# Patient Record
Sex: Female | Born: 1965
Health system: Southern US, Community
[De-identification: ages and names within clinical notes are randomized; demographics above are authoritative.]

---

## 1997-10-22 ENCOUNTER — Inpatient Hospital Stay (HOSPITAL_COMMUNITY): Admission: AD | Admit: 1997-10-22 | Discharge: 1997-10-24 | Payer: Self-pay | Admitting: Obstetrics and Gynecology

## 1997-11-02 ENCOUNTER — Encounter (HOSPITAL_COMMUNITY): Admission: RE | Admit: 1997-11-02 | Discharge: 1998-01-31 | Payer: Self-pay | Admitting: Obstetrics and Gynecology

## 1999-01-21 ENCOUNTER — Inpatient Hospital Stay (HOSPITAL_COMMUNITY): Admission: AD | Admit: 1999-01-21 | Discharge: 1999-01-24 | Payer: Self-pay | Admitting: Obstetrics and Gynecology

## 1999-01-21 ENCOUNTER — Encounter (INDEPENDENT_AMBULATORY_CARE_PROVIDER_SITE_OTHER): Payer: Self-pay

## 1999-03-01 ENCOUNTER — Other Ambulatory Visit: Admission: RE | Admit: 1999-03-01 | Discharge: 1999-03-01 | Payer: Self-pay | Admitting: Obstetrics and Gynecology

## 2000-02-28 ENCOUNTER — Encounter (INDEPENDENT_AMBULATORY_CARE_PROVIDER_SITE_OTHER): Payer: Self-pay | Admitting: Specialist

## 2000-02-28 ENCOUNTER — Ambulatory Visit (HOSPITAL_BASED_OUTPATIENT_CLINIC_OR_DEPARTMENT_OTHER): Admission: RE | Admit: 2000-02-28 | Discharge: 2000-02-28 | Payer: Self-pay | Admitting: Oral Surgery

## 2000-06-06 ENCOUNTER — Other Ambulatory Visit: Admission: RE | Admit: 2000-06-06 | Discharge: 2000-06-06 | Payer: Self-pay | Admitting: Obstetrics and Gynecology

## 2001-06-17 ENCOUNTER — Other Ambulatory Visit: Admission: RE | Admit: 2001-06-17 | Discharge: 2001-06-17 | Payer: Self-pay

## 2003-10-27 ENCOUNTER — Other Ambulatory Visit: Admission: RE | Admit: 2003-10-27 | Discharge: 2003-10-27 | Payer: Self-pay | Admitting: Obstetrics and Gynecology

## 2004-12-23 ENCOUNTER — Other Ambulatory Visit: Admission: RE | Admit: 2004-12-23 | Discharge: 2004-12-23 | Payer: Self-pay | Admitting: Obstetrics and Gynecology

## 2007-03-26 ENCOUNTER — Encounter: Admission: RE | Admit: 2007-03-26 | Discharge: 2007-03-26 | Payer: Self-pay | Admitting: Obstetrics and Gynecology

## 2008-02-25 ENCOUNTER — Ambulatory Visit (HOSPITAL_COMMUNITY): Admission: RE | Admit: 2008-02-25 | Discharge: 2008-02-25 | Payer: Self-pay | Admitting: Obstetrics and Gynecology

## 2008-07-28 ENCOUNTER — Ambulatory Visit (HOSPITAL_COMMUNITY): Admission: RE | Admit: 2008-07-28 | Discharge: 2008-07-28 | Payer: Self-pay | Admitting: Obstetrics and Gynecology

## 2008-08-12 ENCOUNTER — Encounter (INDEPENDENT_AMBULATORY_CARE_PROVIDER_SITE_OTHER): Payer: Self-pay | Admitting: Interventional Radiology

## 2008-08-12 ENCOUNTER — Ambulatory Visit (HOSPITAL_COMMUNITY): Admission: RE | Admit: 2008-08-12 | Discharge: 2008-08-12 | Payer: Self-pay | Admitting: Obstetrics and Gynecology

## 2010-06-04 ENCOUNTER — Emergency Department (HOSPITAL_COMMUNITY)
Admission: EM | Admit: 2010-06-04 | Discharge: 2010-06-04 | Payer: Self-pay | Source: Home / Self Care | Admitting: Emergency Medicine

## 2010-06-04 LAB — DIFFERENTIAL
Basophils Relative: 0 % (ref 0–1)
Eosinophils Absolute: 0.1 10*3/uL (ref 0.0–0.7)
Neutro Abs: 7.5 10*3/uL (ref 1.7–7.7)
Neutrophils Relative %: 82 % — ABNORMAL HIGH (ref 43–77)

## 2010-06-04 LAB — CBC
HCT: 38.7 % (ref 36.0–46.0)
Hemoglobin: 13.3 g/dL (ref 12.0–15.0)
MCH: 32.8 pg (ref 26.0–34.0)
Platelets: 195 10*3/uL (ref 150–400)
RBC: 4.05 MIL/uL (ref 3.87–5.11)
RDW: 12 % (ref 11.5–15.5)
WBC: 9.2 10*3/uL (ref 4.0–10.5)

## 2010-06-04 LAB — BASIC METABOLIC PANEL
CO2: 27 mEq/L (ref 19–32)
Creatinine, Ser: 0.84 mg/dL (ref 0.4–1.2)
GFR calc Af Amer: >60 mL/min (ref >60)
Glucose, Bld: 114 mg/dL — ABNORMAL HIGH (ref 70–99)

## 2010-06-08 IMAGING — US US SOFT TISSUE HEAD/NECK
1 series · 14 of 25 positions shown · non-contrast
Comparison: None.

CLINICAL DATA: 43-year-old female with goiter.

THYROID ULTRASOUND
TECHNIQUE: Ultrasound examination of the thyroid gland and
adjacent soft tissues was performed.

[Series 1: us soft tissue head/neck · 0.06mm/px · 14 of 36 slices shown]
[im 1/36]
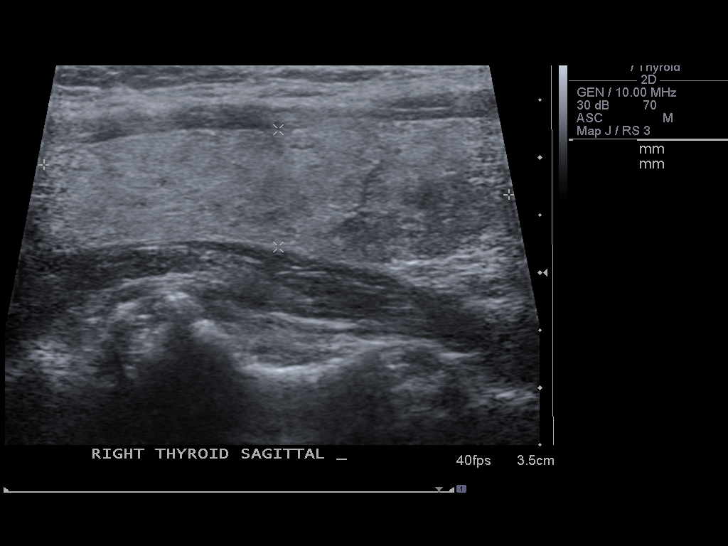
[im 3/36]
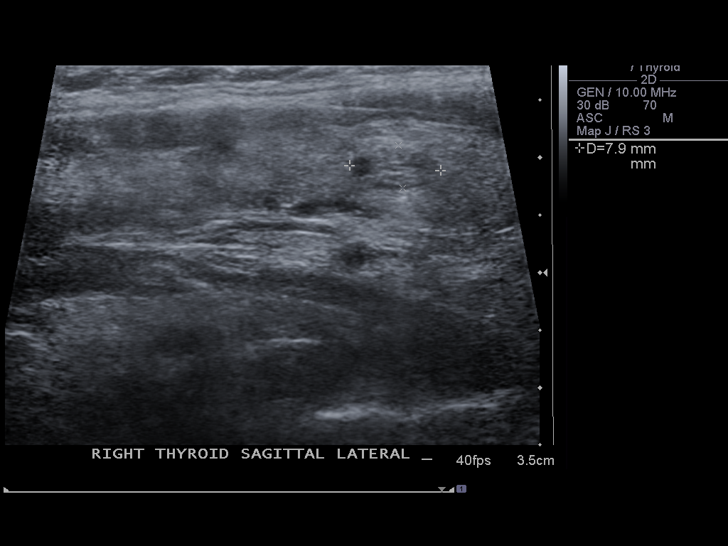
[im 6/36]
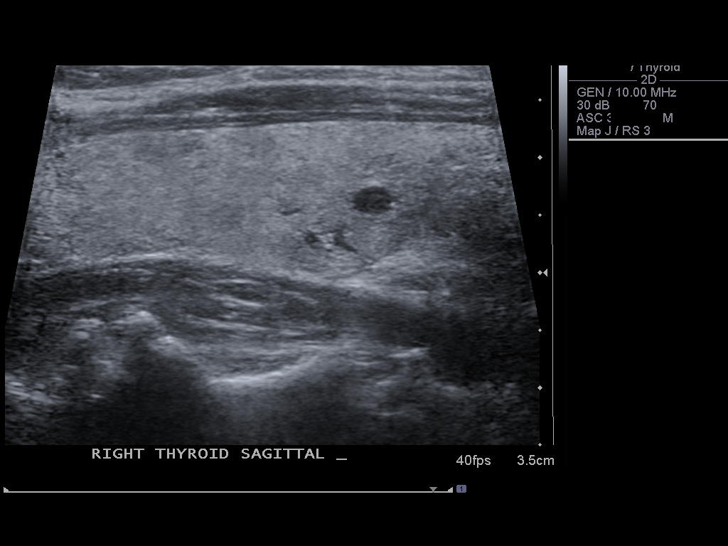
[im 9/36]
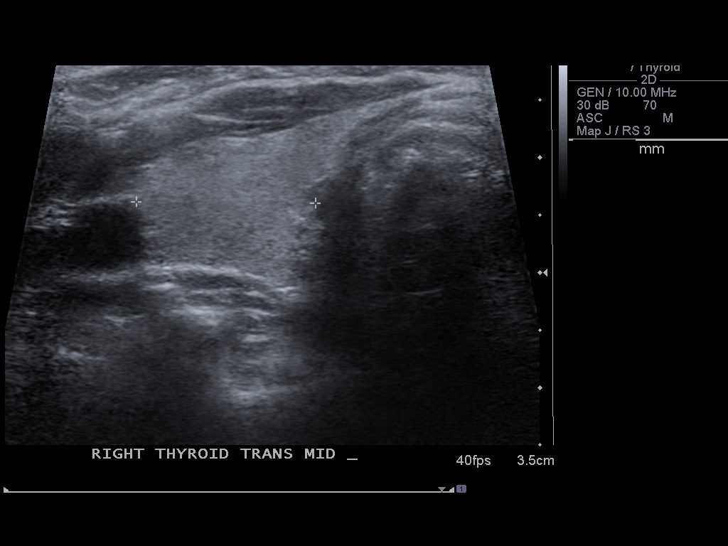
[im 12/36]
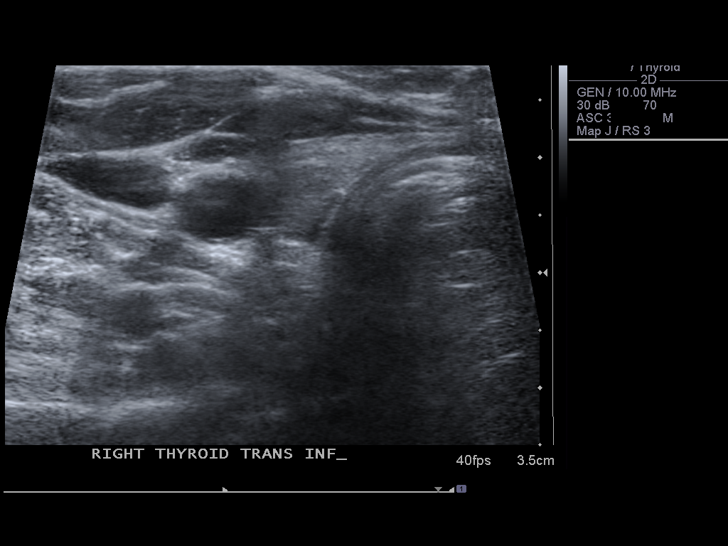
[im 14/36]
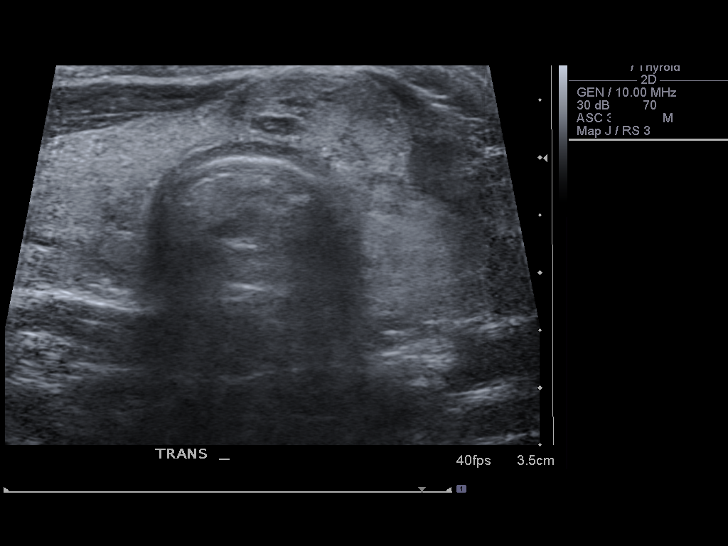
[im 17/36]
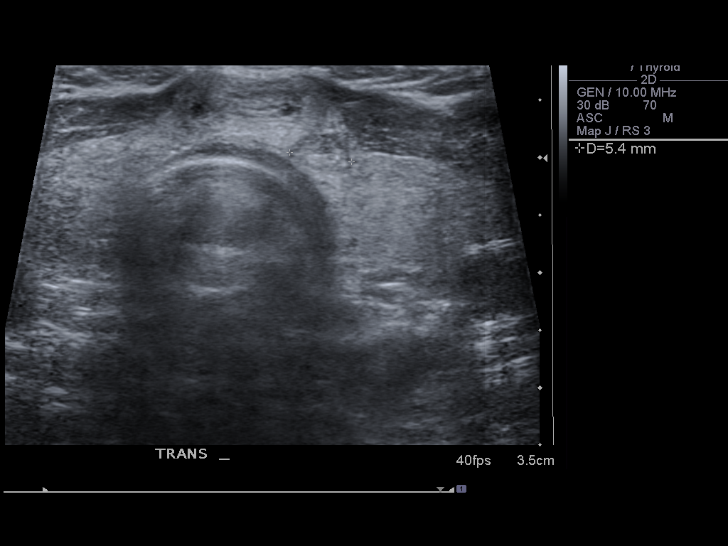
[im 19/36]
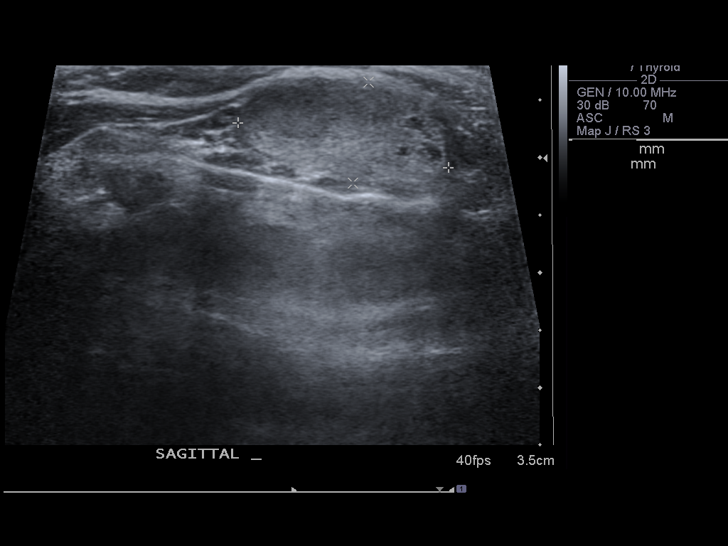
[im 22/36]
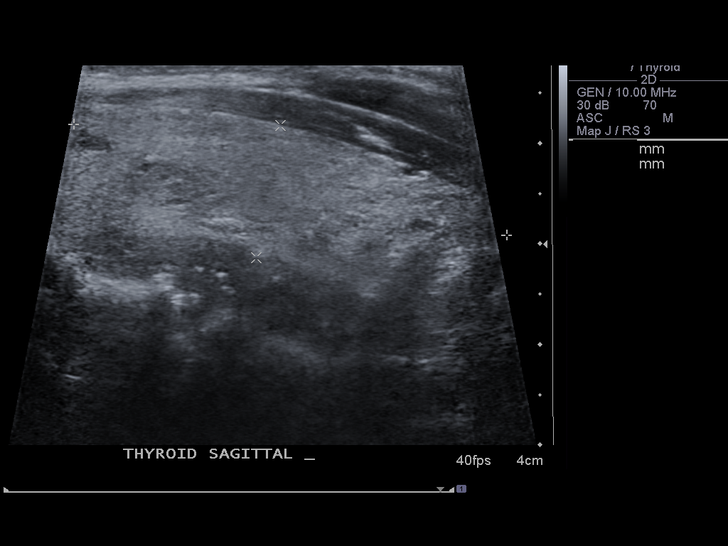
[im 24/36]
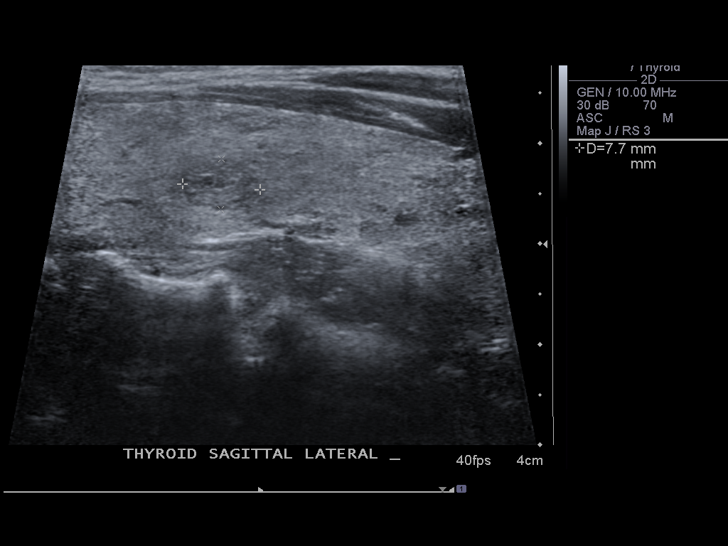
[im 27/36]
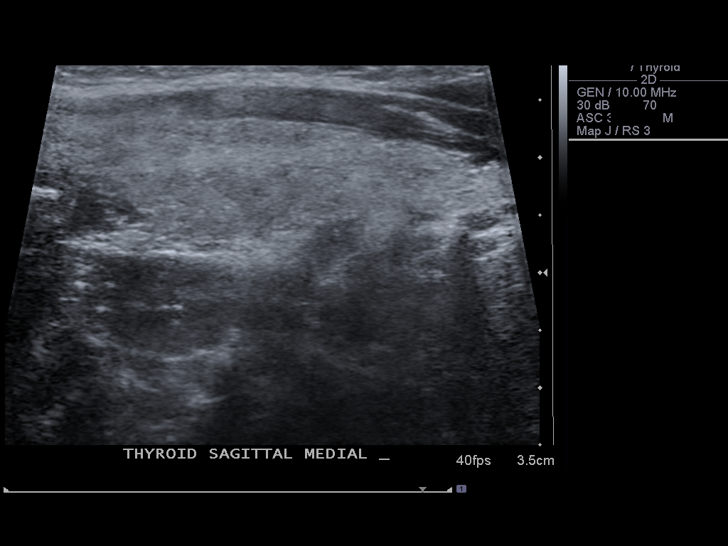
[im 30/36]
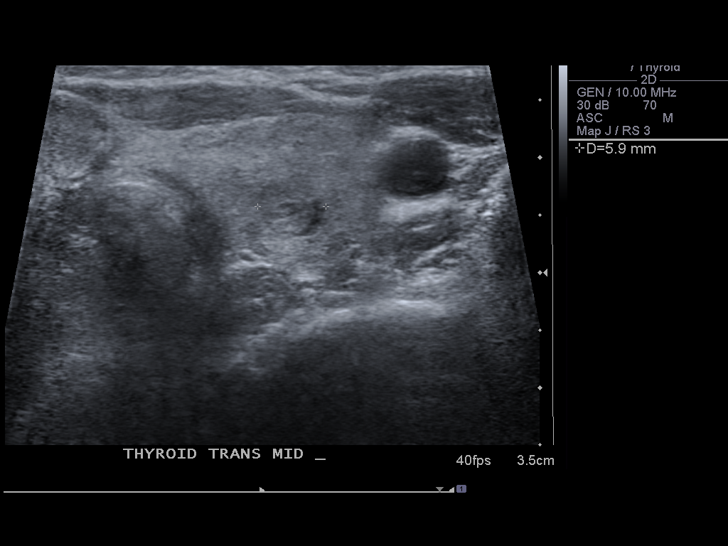
[im 33/36]
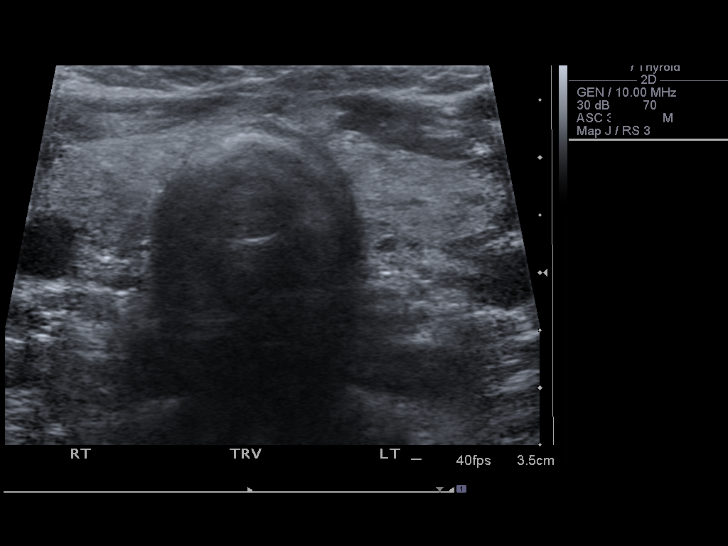
[im 36/36]
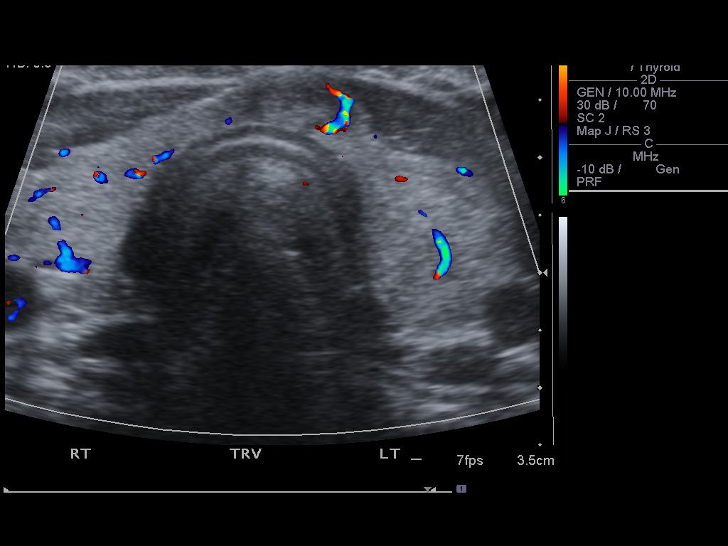

[14 of 25 positions shown; findings below may reference images not displayed]

FINDINGS: The right thyroid lobe is relatively normal measuring
x 1.0 x 1.6 cm although it does contain a sub centimeter
heterogeneous hypo and isoechoic nodule in the lower pole measuring
8 x 4 x 4 mm.

The isthmic region is abnormal and largely replaced by a
heterogeneous hypo to hyperechoic nodule with vascularity measuring
1.9 x 1.6 x 0.9 cm.  Smaller adjacent nodule measuring up to 7 mm
is seen at the junction of the isthmus in the left lobe.

The left lobe measures 4.4 x 1.3 x 1.5 cm and is within normal
limits aside from an isoechoic nodule measuring 8 x 5 x 6 mm
located in the mid pole.  This nodule has scanned vascularity.
IMPRESSION: 1.  Dominant thyroid nodule replacing the isthmus measuring 1.9 x
1.6 x 0.9 cm with vascularity.  Recommend ultrasound guided fine
needle aspiration for histologic correlation.
2.  Otherwise occasional sub centimeter thyroid nodules
bilaterally.

## 2010-07-06 NOTE — Consult Note (Signed)
  NAME:  Linda Clements, CHANDRAN NO.:  [id number]  MEDICAL RECORD NO.:  [id number]          PATIENT TYPE:  EMS  LOCATION:  ED                           FACILITY:  [facility]  PHYSICIAN:  Erasmo Leventhal, M.D.DATE OF BIRTH:  05/14/1965  DATE OF CONSULTATION: DATE OF DISCHARGE:  06/04/2010                                CONSULTATION   HISTORY OF PRESENT ILLNESS:  [name] is a 45 year old right-hand dominant female.  She was at [facility] today with her children, fell onto her left upper extremity, had acute onset of pain. She was brought to the [facility] ED.  She was evaluated there by Dr. Margretta Ditty and found to have  left humerus fracture and I was called. .  I evaluated the patient, she was awake, alert, and oriented to person, place, time, and circumstance.  She only complains of left arm pain.  ALLERGIES:  None.  MEDICATIONS:  None.  PAST SURGICAL HISTORY:  None.  PHYSICAL EXAMINATION:  GENERAL:  She is a very pleasant lady, fairly comfortable. EXTREMITIES:  Left arm showed 2+ swelling, compartments were soft.  Skin is intact.  Distal pulses are 4+.  Motor and sensory grossly intact. Forearm distally intact.  X-ray showed left humeral diaphyseal fracture, displaced.  Discussed with the patient treatment options. I also later discussed this with her husband. By mutual agreement, try conservative management.  PROCEDURE:  IV sedation per ED physician, Dr. Margretta Ditty.  The patient was set up,  gentle gravity reduction,  applied coaptation splint.  The patient felt better and on neurovascular examination, no change.  IMPRESSION:  Left displaced humeral diaphyseal fracture, closed. Neurovascularly intact.  RECOMMENDATIONS:  Discharge home, sleep upright.  Percocet and Valium were dispensed.  She will be seen back in our office on Wednesday.  She understands  at this point in time, we will try conservative management. If it does not go properly, then she will  require surgical intervention. All questions  encouraged  and answered in detail.         ______________________________ Erasmo Leventhal, M.D.    RAC/MEDQ  D:  06/04/2010  T:  06/05/2010  Job:  [id number]  Electronically Signed by Eugenia Mcalpine M.D. on 07/06/2010 05:19:12 PM

## 2010-09-20 NOTE — Op Note (Signed)
NAME:  Linda Clements, Linda Clements NO.:  [id number]   MEDICAL RECORD NO.:  [id number]          PATIENT TYPE:  AMB   LOCATION:  SDC                           FACILITY:  [facility]   PHYSICIAN:  Guy Sandifer. Henderson Cloud, M.D. DATE OF BIRTH:  [birth year]   DATE OF PROCEDURE:  02/25/2008  DATE OF DISCHARGE:                               OPERATIVE REPORT   PREOPERATIVE DIAGNOSIS:  Recurrent left Bartholin gland abscess.   POSTOPERATIVE DIAGNOSES:  Recurrent left Bartholin gland abscess.   PROCEDURE:  Marsupialization of left Bartholin gland abscess.   SURGEON:  Guy Sandifer. Henderson Cloud, MD   ANESTHESIA:  General with LMA.   ESTIMATED BLOOD LOSS:  Minimal.   INDICATIONS AND CONSENT:  This patient is a 45 year old white female  with recurrent left Bartholin abscess.  She is being admitted for  marsupialization of this abscess.  Potential risks and complications  have been discussed preoperatively including but not limited to  infection, bleeding, pelvic pain, dyspareunia, recurrent abscess.  All  questions have been answered and consent is signed on the chart.   PROCEDURE:  The patient was taken to the operating room where she was  identified, placed in a dorsal supine position, and general anesthesia  was induced via LMA.  She was then placed in the dorsal lithotomy  position where she was prepped, bladder straight catheterized, and she  was draped in a sterile fashion.  There was a 5-cm left Bartholin  abscess.  The vaginal mucosa was incised in the vaginal vestibule for  about 3-4 cm overlying the abscess.  The abscess was then perforated and  milky purulent fluid was obtained.  The cavity was explored.  There were  no loculations.  It was also irrigated.  Interrupted sutures were placed  along the perimeter plicating the cyst wall lining to the vaginal mucosa  using 2-0 Vicryl suture.  The overlying vaginal mucosa was injected with  0.5% Marcaine with epinephrine to a limited extent prior to  the  incision.  After the sutures were placed, additional local anesthetic  was placed along the perimeter of the vaginal mucosa.  All counts were  correct.  The patient was awakened, taken to recovery room in stable  condition.      Guy Sandifer Henderson Cloud, M.D.  Electronically Signed     JET/MEDQ  D:  02/25/2008  T:  02/26/2008  Job:  [id number]

## 2010-09-23 NOTE — Op Note (Signed)
[facility]  Patient:    Linda Clements, Linda Clements                        MRN: [id number] Proc. Date: 02/28/00 Adm. Date:  [id number] Attending:  Retia Passe                           Operative Report  PREOPERATIVE DIAGNOSES: 1. A 3.0 cm intrabony cyst left posterior mandible. 2. A 2.0 cm intrabony cyst right posterior mandible. 3. Impacted third molar teeth, #1, 16, 17, and 32.  POSTOPERATIVE DIAGNOSES: 1. A 3.0 cm intrabony cyst left posterior mandible. 2. A 2.0 cm intrabony cyst right posterior mandible. 3. Impacted third molar teeth, #1, 16, 17, and 32.  OPERATION: 1. Removal of right and left intrabony cyst of mandible. 2. Removal of impacted third molar teeth, #1, 16, 17, and 32.  SURGEON:  Vania Rea. Warren Danes, D.D.S.  FIRST ASSISTANT:  Marshall  ANESTHESIA:  General via nasal endotracheal intubation.  ESTIMATED BLOOD LOSS:  Less than 50 cc.  FLUID REPLACEMENT:  Approximately 1100 cc of crystalloid solution.  COMPLICATIONS:  None apparent.  INDICATION FOR PROCEDURE:  [name] is a 45 year old female who has reported to my office for removal of her third molar teeth and the associated cysts. The right and left mandibular molars had developed significantly large intrabony cysts with displacement of tooth #32 and around the right neurovascular bundle.  Due to the position of tooth #32 and the cysts, the procedure was scheduled as an outpatient under general anesthesia.  DESCRIPTION OF PROCEDURE:  On February 28, 2000, [name] was taken to [facility] where she was placed on the operating room table in a supine position.  Following successful nasal endotracheal intubation and general anesthesia, the patients face, neck, and oral cavity were prepped and draped in the usual sterile operating room fashion.  Attention was then directed intraorally, where approximately 10 cc of 0.25% Xylocaine containing 1:200,000  epinephrine were infiltrated in the maxillary right and left posterior superior alveolar neurovascular regions, the corresponding palatal soft tissues, and the right and left inferior alveolar neurovascular regions. A #15 Bard Parker blade was then used to create a 1.5 curvilinear incision over bed of tooth #1.  A full thickness mucoperiosteal flap was elevated laterally and superiorly using a #9 periosteal elevator.  The overlying cortical bone was identified and then reduced using a Stryker rotary osteotome with a #8 round bur.  The purchase point was then placed into the tooth using the Stryker rotary osteotome and the tooth was subluxated from the alveolus using a 91 dental elevator.  Surrounding dental follicular tissue was curetted and removed.  Bony margins were then smoothed and contoured with a small osseous file and the surgical site was thoroughly irrigated with sterile saline irrigating solution and suctioned.  The mucoperiosteum margins were then approximated and sutured in an interrupted fashion using 4-0 chromic suture material on an FS2 needle.  In a similar fashion, embedded tooth #16 was removed as well.  Attention was then directed towards the right posterior mandible, where a 3.5 curvilinear incision was made through the mucosal tissues of the ascending ramus and brought through the mucoperiosteal tissues of the right lateral oblique ridge.  A #9 periosteal elevator was then used to reflect a full thickness mucoperiosteal flap lingually and laterally exposing the cortical bone.  The overlying cortical bone  was then reduced using the Stryker rotary osteotome and a #8 round bur.  Embedded tooth #32 was then sectioned.  There is long and vertical coaxes and gently teased from the neurovascular bundle.  The neurovascular bundle remained intact.  Surrounding dental follicular tissue and cyst was then curetted using a double ended curet and submitted for a histologic  examination. Bony margins were smoothed using the Stryker rotary osteotome and the #8 round bur with copious irrigation. Fluids were suctioned and the mucoperiosteal tissues were approximated and sutured in a continuous interlocking fashion using 4-0 chromic suture material on the FS2 needle.  In a similar fashion, the left third molar tooth and associated cyst were removed.  The cyst on the left side was submitted for histologic examination.  The oral cavity was then thoroughly suctioned and the throat pack removed.  The hypopharynx suctioned free of fluids and secretions and moistened Raytex gauze was placed bilaterally to control bleeding.  The patient was allowed to awaken from the anesthesia and taken to the recovery room. She tolerated the procedure well and without apparent complication. DD:  02/28/00 TD:  02/28/00 Job: [id number]

## 2011-02-07 LAB — CBC
HCT: 41.7
Hemoglobin: 14
MCV: 99.6
Platelets: 205
RBC: 4.19
RDW: 12.6
WBC: 11.2 — ABNORMAL HIGH

## 2011-11-23 ENCOUNTER — Telehealth (INDEPENDENT_AMBULATORY_CARE_PROVIDER_SITE_OTHER): Payer: Self-pay

## 2011-11-23 ENCOUNTER — Other Ambulatory Visit (INDEPENDENT_AMBULATORY_CARE_PROVIDER_SITE_OTHER): Payer: Self-pay

## 2011-11-23 DIAGNOSIS — E041 Nontoxic single thyroid nodule: Secondary | ICD-10-CM

## 2011-11-23 NOTE — Telephone Encounter (Signed)
Pt advised time for u/s and ov. Pt request to call [facility] and set up u/s date then will call our office to set up ov with our office.

## 2014-01-22 ENCOUNTER — Other Ambulatory Visit: Payer: Self-pay | Admitting: Obstetrics and Gynecology

## 2014-01-23 LAB — CYTOLOGY - PAP

## 2017-09-20 ENCOUNTER — Other Ambulatory Visit: Payer: Self-pay | Admitting: Orthopedic Surgery

## 2017-09-20 DIAGNOSIS — G8929 Other chronic pain: Secondary | ICD-10-CM

## 2017-09-20 DIAGNOSIS — M25569 Pain in unspecified knee: Principal | ICD-10-CM

## 2019-07-12 ENCOUNTER — Ambulatory Visit: Payer: Self-pay | Attending: Internal Medicine

## 2019-07-12 DIAGNOSIS — Z23 Encounter for immunization: Secondary | ICD-10-CM | POA: Insufficient documentation

## 2019-07-12 NOTE — Progress Notes (Signed)
   Covid-19 Vaccination Clinic  Name:  Linda Clements    MRN: [id number] DOB: 02/07/1966  07/12/2019  Linda Clements was observed post Covid-19 immunization for 15 minutes without incident. She was provided with Vaccine Information Sheet and instruction to access the V-Safe system.   Linda Clements was instructed to call 911 with any severe reactions post vaccine: Marland Kitchen Difficulty breathing  . Swelling of face and throat  . A fast heartbeat  . A bad rash all over body  . Dizziness and weakness   Immunizations Administered    Name Date Dose VIS Date Route   Pfizer COVID-19 Vaccine 07/12/2019  9:09 AM 0.3 mL 04/18/2019 Intramuscular   Manufacturer: [employer], [employer]   Lot: [id number]   NDC: [address]-3

## 2019-08-02 ENCOUNTER — Ambulatory Visit: Payer: Self-pay | Attending: Internal Medicine

## 2019-08-02 DIAGNOSIS — Z23 Encounter for immunization: Secondary | ICD-10-CM

## 2019-08-02 NOTE — Progress Notes (Signed)
   Covid-19 Vaccination Clinic  Name:  KAYIA BILLINGER    MRN: [id number] DOB: 1965/12/14  08/02/2019  Ms. Winebarger was observed post Covid-19 immunization for 15 minutes without incident. She was provided with Vaccine Information Sheet and instruction to access the V-Safe system.   Ms. Semel was instructed to call 911 with any severe reactions post vaccine: Marland Kitchen Difficulty breathing  . Swelling of face and throat  . A fast heartbeat  . A bad rash all over body  . Dizziness and weakness   Immunizations Administered    Name Date Dose VIS Date Route   Pfizer COVID-19 Vaccine 08/02/2019  8:26 AM 0.3 mL 04/18/2019 Intramuscular   Manufacturer: [employer], [employer]   Lot: [id number]   NDC: [address]-8

## 2019-12-17 DIAGNOSIS — Z6826 Body mass index (BMI) 26.0-26.9, adult: Secondary | ICD-10-CM | POA: Diagnosis not present

## 2019-12-17 DIAGNOSIS — Z1151 Encounter for screening for human papillomavirus (HPV): Secondary | ICD-10-CM | POA: Diagnosis not present

## 2019-12-17 DIAGNOSIS — R319 Hematuria, unspecified: Secondary | ICD-10-CM | POA: Diagnosis not present

## 2019-12-17 DIAGNOSIS — Z130 Encounter for screening for diseases of the blood and blood-forming organs and certain disorders involving the immune mechanism: Secondary | ICD-10-CM | POA: Diagnosis not present

## 2019-12-17 DIAGNOSIS — Z1231 Encounter for screening mammogram for malignant neoplasm of breast: Secondary | ICD-10-CM | POA: Diagnosis not present

## 2019-12-17 DIAGNOSIS — Z01419 Encounter for gynecological examination (general) (routine) without abnormal findings: Secondary | ICD-10-CM | POA: Diagnosis not present

## 2019-12-17 DIAGNOSIS — Z124 Encounter for screening for malignant neoplasm of cervix: Secondary | ICD-10-CM | POA: Diagnosis not present

## 2019-12-18 ENCOUNTER — Encounter (INDEPENDENT_AMBULATORY_CARE_PROVIDER_SITE_OTHER): Payer: Self-pay

## 2019-12-19 ENCOUNTER — Other Ambulatory Visit: Payer: Self-pay | Admitting: Obstetrics and Gynecology

## 2019-12-19 DIAGNOSIS — M8580 Other specified disorders of bone density and structure, unspecified site: Secondary | ICD-10-CM

## 2019-12-23 DIAGNOSIS — Z03818 Encounter for observation for suspected exposure to other biological agents ruled out: Secondary | ICD-10-CM | POA: Diagnosis not present

## 2019-12-23 DIAGNOSIS — Z20828 Contact with and (suspected) exposure to other viral communicable diseases: Secondary | ICD-10-CM | POA: Diagnosis not present

## 2019-12-24 DIAGNOSIS — Z03818 Encounter for observation for suspected exposure to other biological agents ruled out: Secondary | ICD-10-CM | POA: Diagnosis not present

## 2019-12-27 DIAGNOSIS — Z20822 Contact with and (suspected) exposure to covid-19: Secondary | ICD-10-CM | POA: Diagnosis not present

## 2020-01-02 DIAGNOSIS — Z20822 Contact with and (suspected) exposure to covid-19: Secondary | ICD-10-CM | POA: Diagnosis not present

## 2020-01-13 ENCOUNTER — Encounter: Payer: Self-pay | Admitting: Cardiovascular Disease

## 2020-01-13 ENCOUNTER — Other Ambulatory Visit: Payer: Self-pay

## 2020-01-13 ENCOUNTER — Ambulatory Visit (INDEPENDENT_AMBULATORY_CARE_PROVIDER_SITE_OTHER): Payer: BC Managed Care – PPO | Admitting: Cardiovascular Disease

## 2020-01-13 VITALS — BP 128/90 | HR 69 | Ht 65.0 in | Wt 160.6 lb

## 2020-01-13 DIAGNOSIS — I10 Essential (primary) hypertension: Secondary | ICD-10-CM

## 2020-01-13 DIAGNOSIS — Z8249 Family history of ischemic heart disease and other diseases of the circulatory system: Secondary | ICD-10-CM

## 2020-01-13 HISTORY — DX: Essential (primary) hypertension: I10

## 2020-01-13 NOTE — Progress Notes (Signed)
Cardiology Office Note   Date:  01/13/2020   ID:  AVA TANGNEY, DOB [birth year], MRN [id number]  PCP:  Patient, No Pcp Per  Cardiologist:   Chilton Si, MD   No chief complaint on file.   History of Present Illness: CHRISTIEN BERTHELOT is a 54 y.o. female who is being seen today for the evaluation of family history of CAD at the request of Bovard-Stuckert, Jody, *.  He can has a family history multiple family members with coronary artery disease.  Her father had a CABG in his 27s and her brother recently had bypass surgery at 47.  She has another brother with a history of atrial fibrillation.  Her mom has some heart rate irregularities but she does not think she had atrial fibrillation.  Overall Ms. Colborn has felt well.  She exercises by walking 2.5 miles daily.  She feels good with exercise.  She has no chest pain or shortness of breath.  She denies any lower extremity edema, orthopnea, or PND.  Recently she saw her OB/GYN her blood pressure was 152/80.  After resting for a while it came down to 140 systolic.  And only blood pressure has been in the 110s to 120s.  Her OB/GYN recommended that she start amlodipine 5 mg daily.  She has not yet started it.  She tries to have a healthy diet and mostly cooks at home.  She tries to limit sodium intake.  She drinks 2 coffees in the morning and does not have more caffeine later in the day.  She denies any NSAIDs or supplements.  She snores but had no apnea and feels well rested in the mornings.   Past Medical History:  Diagnosis Date  . Essential hypertension 01/13/2020    History reviewed. No pertinent surgical history.   Current Outpatient Medications  Medication Sig Dispense Refill  . amLODipine (NORVASC) 5 MG tablet Take 5 mg by mouth daily.     No current facility-administered medications for this visit.    Allergies:   Penicillins    Social History:  The patient  reports that she has never smoked. She has never used smokeless tobacco.    Family History:  The patient's family history includes Atrial fibrillation in her brother; CAD in her brother and father.    ROS:  Please see the history of present illness.   Otherwise, review of systems are positive for none.   All other systems are reviewed and negative.    PHYSICAL EXAM: VS:  BP 128/90   Pulse 69   Ht 5\' 5"  (1.651 m)   Wt 160 lb 9.6 oz (72.8 kg)   SpO2 96%   BMI 26.73 kg/m  , BMI Body mass index is 26.73 kg/m. GENERAL:  Well appearing HEENT:  Pupils equal round and reactive, fundi not visualized, oral mucosa unremarkable NECK:  No jugular venous distention, waveform within normal limits, carotid upstroke brisk and symmetric, no bruits, no thyromegaly LYMPHATICS:  No cervical adenopathy LUNGS:  Clear to auscultation bilaterally HEART:  RRR.  PMI not displaced or sustained,S1 and S2 within normal limits, no S3, no S4, no clicks, no rubs, no murmurs ABD:  Flat, positive bowel sounds normal in frequency in pitch, no bruits, no rebound, no guarding, no midline pulsatile mass, no hepatomegaly, no splenomegaly EXT:  2 plus pulses throughout, no edema, no cyanosis no clubbing SKIN:  No rashes no nodules NEURO:  Cranial nerves II through XII grossly intact, motor grossly intact throughout  PSYCH:  Cognitively intact, oriented to person place and time   EKG:  EKG is ordered today. The ekg ordered today demonstrates sinus rhythm.  Rate 69 bpm.  LAFB.   Recent Labs: No results found for requested labs within last 8760 hours.    Lipid Panel No results found for: CHOL, TRIG, HDL, CHOLHDL, VLDL, LDLCALC, LDLDIRECT    Wt Readings from Last 3 Encounters:  01/13/20 160 lb 9.6 oz (72.8 kg)      ASSESSMENT AND PLAN:   # Essential hypertension: Blood pressure is poorly controlled today.  It has been better at home.  She already has a prescription for amlodipine 5 mg daily.  She will start taking this and tracking her blood pressure at home.  She will bring this to  follow-up.  She will also work on increasing her exercise to 150 minutes weekly.  Continue to limit sodium intake.  #Family history of CAD: No family history of premature CAD.  She will come back for fasting lipids.  She is also interested in getting a coronary calcium score    Current medicines are reviewed at length with the patient today.  The patient does not have concerns regarding medicines.  The following changes have been made:  no change  Labs/ tests ordered today include:   Orders Placed This Encounter  Procedures  . CT CARDIAC SCORING  . Lipid panel  . Comprehensive metabolic panel  . EKG 12-Lead     Disposition:   FU with Regana Kemple C. Duke Salvia, MD, [facility] in 6 weeks virtually    Signed, Licet Dunphy C. Duke Salvia, MD, [facility]  01/13/2020 5:27 PM    [facility]

## 2020-01-13 NOTE — Patient Instructions (Signed)
Medication Instructions:  START THE AMLODIPINE 5 MG DAILY   *If you need a refill on your cardiac medications before your next appointment, please call your pharmacy*  Lab Work: FASTING LP/CMET SOON   If you have labs (blood work) drawn today and your tests are completely normal, you will receive your results only by: Marland Kitchen MyChart Message (if you have MyChart) OR . A paper copy in the mail If you have any lab test that is abnormal or we need to change your treatment, we will call you to review the results.  Testing/Procedures: CALCIUM SCORE-THIS WILL COST YOU $150 OUT OF POCKET   Follow-Up: At [facility], you and your health needs are our priority.  As part of our continuing mission to provide you with exceptional heart care, we have created designated Provider Care Teams.  These Care Teams include your primary Cardiologist (physician) and Advanced Practice Providers (APPs -  Physician Assistants and Nurse Practitioners) who all work together to provide you with the care you need, when you need it.  We recommend signing up for the patient portal called "MyChart".  Sign up information is provided on this After Visit Summary.  MyChart is used to connect with patients for Virtual Visits (Telemedicine).  Patients are able to view lab/test results, encounter notes, upcoming appointments, etc.  Non-urgent messages can be sent to your provider as well.   To learn more about what you can do with MyChart, go to [email or web address].    Your next appointment:   6 week(s)  The format for your next appointment:   Virtual Visit   Provider:   You may see DR [facility] or one of the following Advanced Practice Providers on your designated Care Team:    Corine Shelter, PA-C  [place], [place]  Edd Fabian, [place]  Other Instructions  MONITOR AND LOG YOUR BLOOD PRESSURE, HAVE READINGS AVAILABLE AT YOUR FOLLOW UP VISIT

## 2020-01-27 ENCOUNTER — Other Ambulatory Visit: Payer: BC Managed Care – PPO

## 2020-02-04 DIAGNOSIS — Z8249 Family history of ischemic heart disease and other diseases of the circulatory system: Secondary | ICD-10-CM | POA: Diagnosis not present

## 2020-02-04 DIAGNOSIS — I10 Essential (primary) hypertension: Secondary | ICD-10-CM | POA: Diagnosis not present

## 2020-02-04 LAB — COMPREHENSIVE METABOLIC PANEL
ALT: 20 IU/L (ref 0–32)
AST: 20 IU/L (ref 0–40)
Albumin/Globulin Ratio: 1.6 (ref 1.2–2.2)
Albumin: 4.1 g/dL (ref 3.8–4.9)
Alkaline Phosphatase: 80 IU/L (ref 44–121)
BUN/Creatinine Ratio: 11 (ref 9–23)
BUN: 10 mg/dL (ref 6–24)
Bilirubin Total: 1 mg/dL (ref 0.0–1.2)
CO2: 26 mmol/L (ref 20–29)
Calcium: 9.5 mg/dL (ref 8.7–10.2)
Chloride: 105 mmol/L (ref 96–106)
Creatinine, Ser: 0.91 mg/dL (ref 0.57–1.00)
GFR calc Af Amer: 82 mL/min/{1.73_m2} (ref >59)
GFR calc non Af Amer: 71 mL/min/{1.73_m2} (ref >59)
Globulin, Total: 2.6 g/dL (ref 1.5–4.5)
Glucose: 84 mg/dL (ref 65–99)
Potassium: 4.7 mmol/L (ref 3.5–5.2)
Sodium: 143 mmol/L (ref 134–144)
Total Protein: 6.7 g/dL (ref 6.0–8.5)

## 2020-02-04 LAB — LIPID PANEL
Chol/HDL Ratio: 4.7 ratio — ABNORMAL HIGH (ref 0.0–4.4)
Cholesterol, Total: 245 mg/dL — ABNORMAL HIGH (ref 100–199)
HDL: 52 mg/dL (ref >39)
LDL Chol Calc (NIH): 165 mg/dL — ABNORMAL HIGH (ref 0–99)
Triglycerides: 156 mg/dL — ABNORMAL HIGH (ref 0–149)
VLDL Cholesterol Cal: 28 mg/dL (ref 5–40)

## 2020-02-18 ENCOUNTER — Ambulatory Visit (INDEPENDENT_AMBULATORY_CARE_PROVIDER_SITE_OTHER)
Admission: RE | Admit: 2020-02-18 | Discharge: 2020-02-18 | Disposition: A | Discharge status: Home / Self Care | Payer: Self-pay | Source: Ambulatory Visit | Attending: Cardiovascular Disease | Admitting: Cardiovascular Disease

## 2020-02-18 ENCOUNTER — Other Ambulatory Visit: Payer: Self-pay

## 2020-02-18 DIAGNOSIS — I10 Essential (primary) hypertension: Secondary | ICD-10-CM

## 2020-02-18 DIAGNOSIS — Z8249 Family history of ischemic heart disease and other diseases of the circulatory system: Secondary | ICD-10-CM

## 2020-02-24 ENCOUNTER — Telehealth (INDEPENDENT_AMBULATORY_CARE_PROVIDER_SITE_OTHER): Payer: BC Managed Care – PPO | Admitting: Cardiovascular Disease

## 2020-02-24 ENCOUNTER — Encounter: Payer: Self-pay | Admitting: Cardiovascular Disease

## 2020-02-24 DIAGNOSIS — E7800 Pure hypercholesterolemia, unspecified: Secondary | ICD-10-CM | POA: Diagnosis not present

## 2020-02-24 HISTORY — DX: Pure hypercholesterolemia, unspecified: E78.00

## 2020-02-24 NOTE — Patient Instructions (Addendum)
Medication Instructions:  Your physician recommends that you continue on your current medications as directed. Please refer to the Current Medication list given to you today.  *If you need a refill on your cardiac medications before your next appointment, please call your pharmacy*  Lab Work: FASTING LP/CMET IN 1 YEAR FEW DAYS PRIOR TO FOLLOW UP   If you have labs (blood work) drawn today and your tests are completely normal, you will receive your results only by: Marland Kitchen MyChart Message (if you have MyChart) OR . A paper copy in the mail If you have any lab test that is abnormal or we need to change your treatment, we will call you to review the results.  Testing/Procedures: NONE   Follow-Up: At [facility], you and your health needs are our priority.  As part of our continuing mission to provide you with exceptional heart care, we have created designated Provider Care Teams.  These Care Teams include your primary Cardiologist (physician) and Advanced Practice Providers (APPs -  Physician Assistants and Nurse Practitioners) who all work together to provide you with the care you need, when you need it.  We recommend signing up for the patient portal called "MyChart".  Sign up information is provided on this After Visit Summary.  MyChart is used to connect with patients for Virtual Visits (Telemedicine).  Patients are able to view lab/test results, encounter notes, upcoming appointments, etc.  Non-urgent messages can be sent to your provider as well.   To learn more about what you can do with MyChart, go to [email or web address].    Your next appointment:   12 month(s)  You will receive a reminder letter in the mail two months in advance. If you don't receive a letter, please call our office to schedule the follow-up appointment.  The format for your next appointment:   In Person  Provider:   You may see Chilton Si, MD or one of the following Advanced Practice Providers on your  designated Care Team:    Corine Shelter, PA-C  [place], [place]  Edd Fabian, [place]  Other Instructions TRY TO EXERCISE 150 MINUTES EACH WEEK  LIMIT YOUR FRIED FOODS, FATTY FOODS, RED MEAT, AND CHEESE

## 2020-02-24 NOTE — Progress Notes (Signed)
Virtual Visit via Video Note   This visit type was conducted due to national recommendations for restrictions regarding the COVID-19 Pandemic (e.g. social distancing) in an effort to limit this patient's exposure and mitigate transmission in our community.  Due to her co-morbid illnesses, this patient is at least at moderate risk for complications without adequate follow up.  This format is felt to be most appropriate for this patient at this time.  All issues noted in this document were discussed and addressed.  A limited physical exam was performed with this format.  Please refer to the patient's chart for her consent to telehealth for [facility].     The patient was identified using 2 identifiers.  Date:  02/24/2020   ID:  Linda Clements, DOB 17-Sep-1965, MRN [id number]  Patient Location: Home Provider Location: Office/Clinic  PCP:  Patient, No Pcp Per  Cardiologist:  Chilton Si, MD  Electrophysiologist:  None   Evaluation Performed:  Follow-Up Visit  Chief Complaint:  hypertension  History of Present Illness:     The patient does not have symptoms concerning for COVID-19 infection (fever, chills, cough, or new shortness of breath).   Linda Clements is a 54 y.o. female with hypertension and hyperlipidemia here for follow-up.  She was initially seen for the evaluation of family history of CAD 01/2020.  Linda Clements has a family history of multiple family members with coronary artery disease.  Her father had a CABG in his 61s and her brother recently had bypass surgery at 29.  She has another brother with a history of atrial fibrillation.  Her mom has some heart rate irregularities but she does not think she had atrial fibrillation.  Overall Linda Clements has felt well.  She exercises by walking 2.5 miles daily.  She was referred for coronary 01/2020 that revealed no CAD.  His calcium score is 0.  At her initial assessment her blood pressure was elevated and had been high at her OB/GYN's  office as well.  They had recommended that she start amlodipine but she had not yet started it.  We did encourage her to start it and she has done so.  Her blood pressure has been well-controlled when she checks it.  He denies any lightheadedness, dizziness, chest pain, shortness of breath, lower extremity edema, orthopnea, or PND.  She continues to exercise for about 40 minutes most days of the week.  She has no exertional symptoms.  She did come for fasting lipids that revealed hyperlipidemia.  However, given her ASCVD 10-year risk of 2.7% and normal calcium score she was not started on a statin.  Instead we recommended aggressive diet and exercise interventions.   Past Medical History:  Diagnosis Date  . Essential hypertension 01/13/2020  . Pure hypercholesterolemia 02/24/2020    History reviewed. No pertinent surgical history.   Current Outpatient Medications  Medication Sig Dispense Refill  . amLODipine (NORVASC) 5 MG tablet Take 5 mg by mouth daily.     No current facility-administered medications for this visit.    Allergies:   Penicillins    Social History:  The patient  reports that she has never smoked. She has never used smokeless tobacco.   Family History:  The patient's family history includes Atrial fibrillation in her brother; CAD in her brother and father.    ROS:  Please see the history of present illness.   Otherwise, review of systems are positive for none.   All other systems are reviewed and  negative.    PHYSICAL EXAM: BP 113/73   Pulse 64   Ht 5\' 5"  (1.651 m)   Wt 158 lb (71.7 kg)   BMI 26.29 kg/m  GENERAL: Well-appearing.  No acute distress. HEENT: Pupils equal round.  Oral mucosa unremarkable NECK:  No jugular venous distention, no visible thyromegaly EXT:  No edema, no cyanosis no clubbing SKIN:  No rashes no nodules NEURO:  Speech fluent.  Cranial nerves grossly intact.  Moves all 4 extremities freely PSYCH:  Cognitively intact, oriented to person  place and time   EKG:  EKG is not ordered today. The ekg ordered 01/13/20 demonstrates sinus rhythm.  Rate 69 bpm.  LAFB.   Recent Labs: 02/04/2020: ALT 20; BUN 10; Creatinine, Ser 0.91; Potassium 4.7; Sodium 143    Lipid Panel    Component Value Date/Time   CHOL 245 (H) 02/04/2020 0813   TRIG 156 (H) 02/04/2020 0813   HDL 52 02/04/2020 0813   CHOLHDL 4.7 (H) 02/04/2020 0813   LDLCALC 165 (H) 02/04/2020 0813      Wt Readings from Last 3 Encounters:  02/24/20 158 lb (71.7 kg)  01/13/20 160 lb 9.6 oz (72.8 kg)      ASSESSMENT AND PLAN:   # Essential hypertension: Blood pressure is better controlled on amlodipine.  She is tolerating it well.  She will continue with her exercise and limiting sodium intake.  #Family history of CAD: # Hyperlipidemia: Positive family history of premature CAD.  However her calcium score was 01/2020.  Her lipids are quite elevated.  However her ASCVD 10-year risk is 2.7%.  Instead recommended diet and exercise rather than a statin.  We will repeat her lipids in 1 year.    Current medicines are reviewed at length with the patient today.  The patient does not have concerns regarding medicines.  The following changes have been made:  no change  Labs/ tests ordered today include:   No orders of the defined types were placed in this encounter.    Disposition:   FU with Daiel Strohecker C. 02/2020, MD, [facility] in 1 year.  COVID-19 Education: The signs and symptoms of COVID-19 were discussed with the patient and how to seek care for testing (follow up with PCP or arrange E-visit).  The importance of social distancing was discussed today.  Time:   Today, I have spent 12 minutes with the patient with telehealth technology discussing the above problems.        Signed, Kalisha Keadle C. [facility], MD, [facility]  02/24/2020 8:21 AM    [facility]

## 2020-03-11 ENCOUNTER — Ambulatory Visit
Admission: RE | Admit: 2020-03-11 | Discharge: 2020-03-11 | Disposition: A | Discharge status: Home / Self Care | Payer: BC Managed Care – PPO | Source: Ambulatory Visit | Attending: Obstetrics and Gynecology | Admitting: Obstetrics and Gynecology

## 2020-03-11 ENCOUNTER — Other Ambulatory Visit: Payer: Self-pay

## 2020-03-11 DIAGNOSIS — M8580 Other specified disorders of bone density and structure, unspecified site: Secondary | ICD-10-CM

## 2020-03-11 DIAGNOSIS — Z780 Asymptomatic menopausal state: Secondary | ICD-10-CM | POA: Diagnosis not present

## 2020-03-11 DIAGNOSIS — M8589 Other specified disorders of bone density and structure, multiple sites: Secondary | ICD-10-CM | POA: Diagnosis not present

## 2021-12-29 IMAGING — CT CT CARDIAC CORONARY ARTERY CALCIUM SCORE
3 series · 14 of 20 positions shown, 15 images · non-contrast
Comparison: None.
COMPARISON: None.

Addendum:
EXAM:
OVER-READ INTERPRETATION  CT CHEST

The following report is an over-read performed by radiologist Dr.
Miumiu Doronio [REDACTED] on 02/18/2020. This
over-read does not include interpretation of cardiac or coronary
anatomy or pathology. The coronary calcium score/coronary CTA
interpretation by the cardiologist is attached.
CLINICAL DATA: Risk stratification
Coronary Calcium Score
TECHNIQUE: The patient was scanned on a Siemens Force scanner. Axial
non-contrast 3 mm slices were carried out through the heart. The
data set was analyzed on a dedicated work station and scored using
the Agatson method.

[Series 2: casc 3.0 bv41 2 bestdiast 80 % · axial · 0.31mm/px · z∈[-214,-136]mm · 4 of 44 slices shown, 5 images]
[im 9/44  vessel]
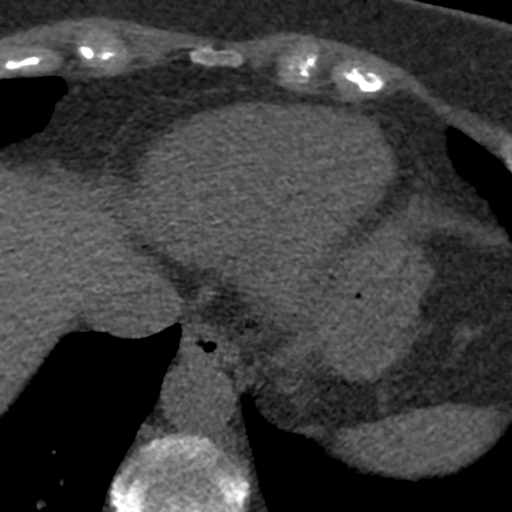
[im 9/44  lung]
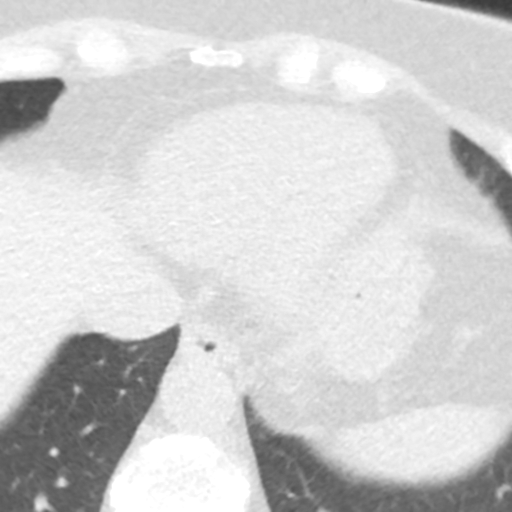
[im 18/44  vessel]
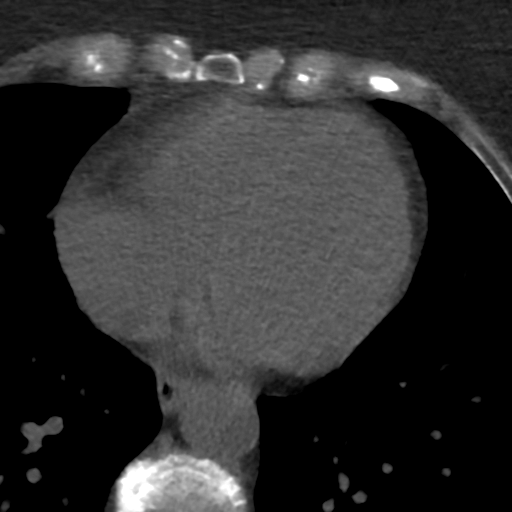
[im 26/44  vessel]
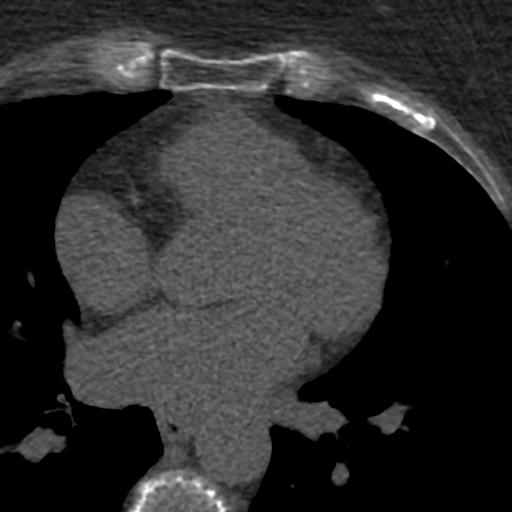
[im 35/44  vessel]
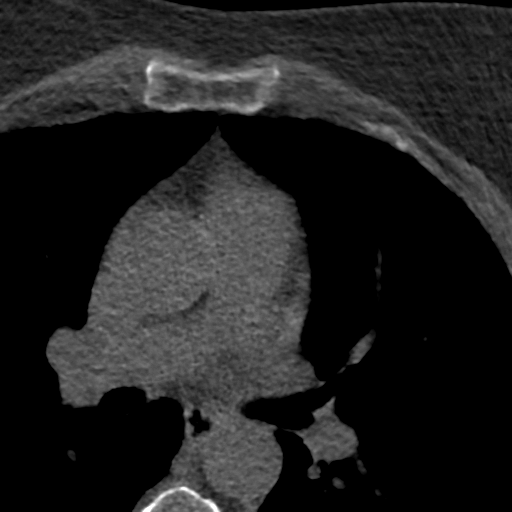

[Series 3: lung 75 % · axial · 0.64mm/px · z∈[-216,-132]mm · 5 of 44 slices shown]
[im 8/44  lung]
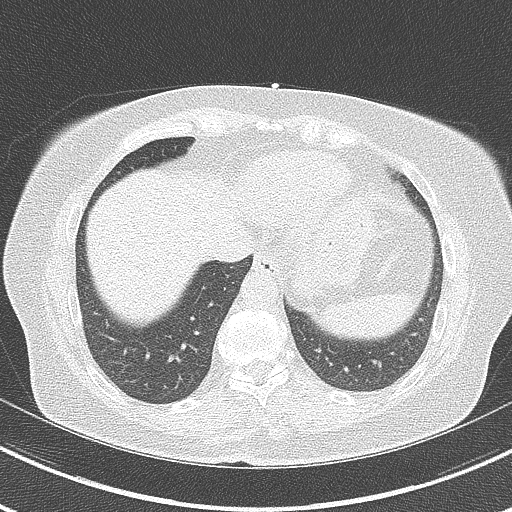
[im 15/44  lung]
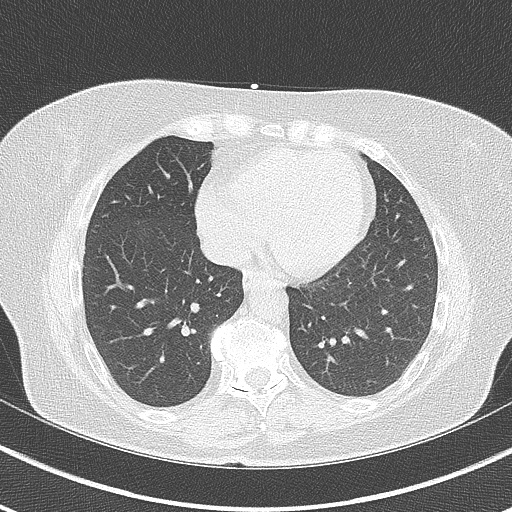
[im 22/44  lung]
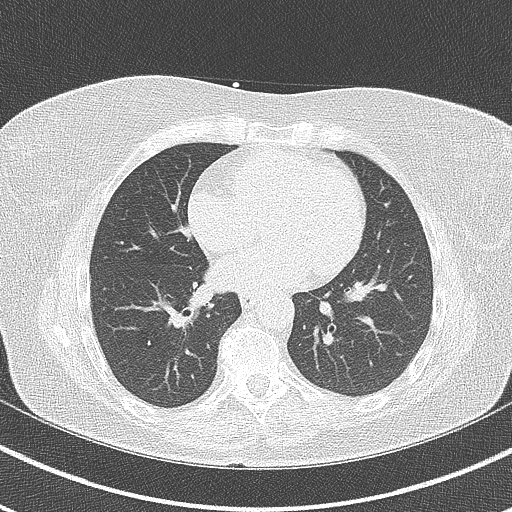
[im 29/44  lung]
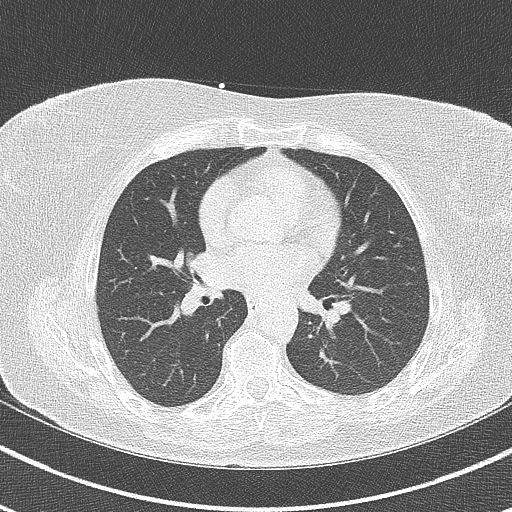
[im 36/44  lung]
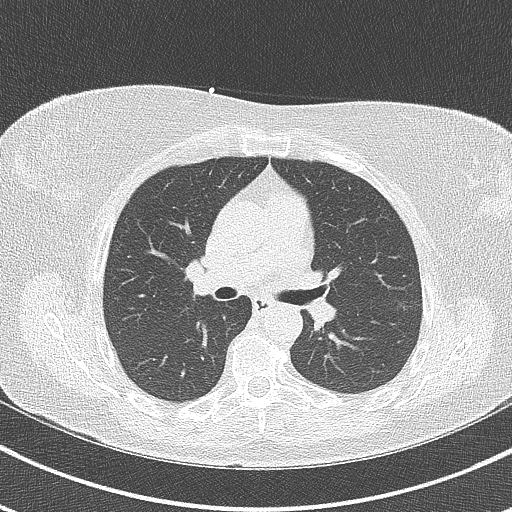

[Series 4: lung st 75 % · axial · 0.64mm/px · z∈[-216,-132]mm · 5 of 44 slices shown]
[im 8/44  lung]
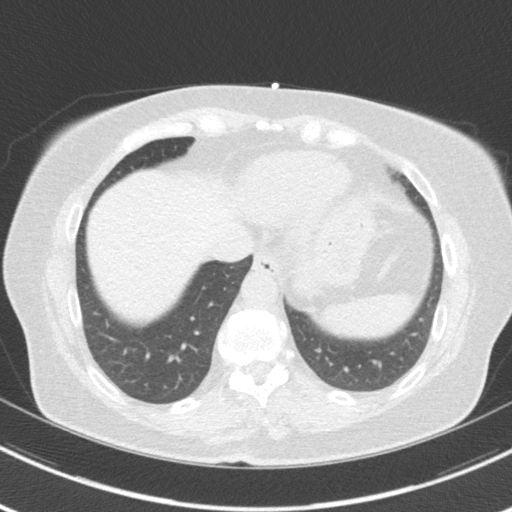
[im 15/44  lung]
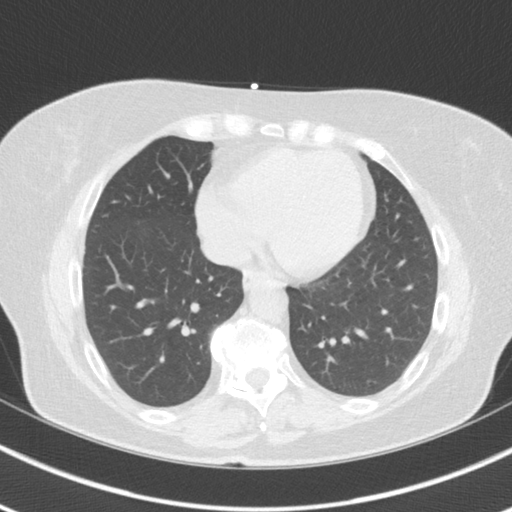
[im 22/44  lung]
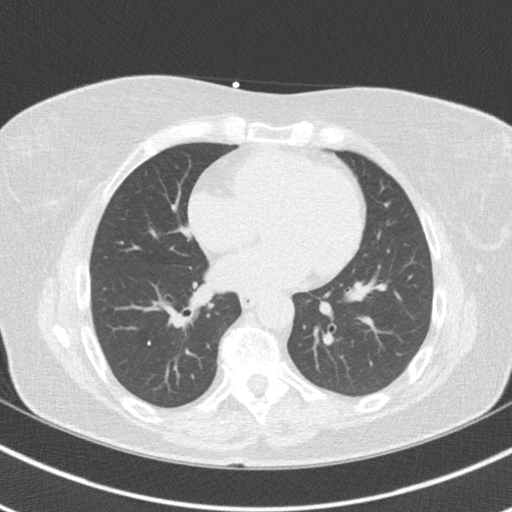
[im 29/44  lung]
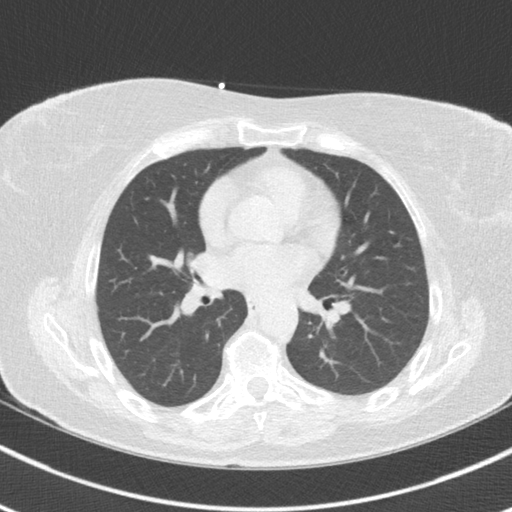
[im 36/44  lung]
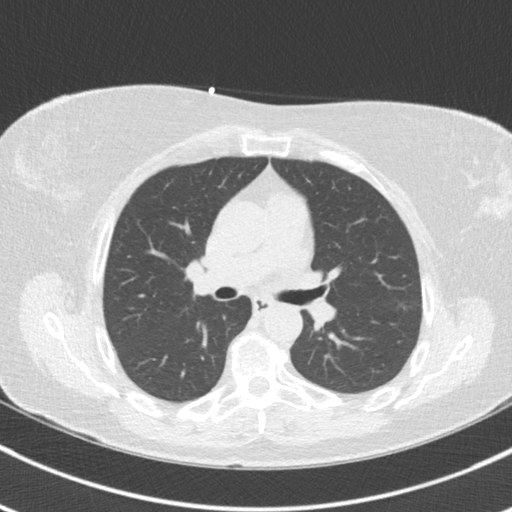

[14 of 20 positions shown; findings below may reference images not displayed]

FINDINGS: Within the visualized portions of the thorax there are no suspicious
appearing pulmonary nodules or masses, there is no acute
consolidative airspace disease, no pleural effusions, no
pneumothorax and no lymphadenopathy. Visualized portions of the
upper abdomen are unremarkable. There are no aggressive appearing
lytic or blastic lesions noted in the visualized portions of the
skeleton.
IMPRESSION: No significant incidental noncardiac findings are noted.
FINDINGS: Non-cardiac: See separate report from [REDACTED].

Ascending Aorta: Normal caliber

Pericardium: Normal

Coronary arteries: Normal origins
IMPRESSION: Coronary calcium score of 0.

*** End of Addendum ***
EXAM:
OVER-READ INTERPRETATION  CT CHEST

The following report is an over-read performed by radiologist Dr.
Miumiu Doronio [REDACTED] on 02/18/2020. This
over-read does not include interpretation of cardiac or coronary
anatomy or pathology. The coronary calcium score/coronary CTA
interpretation by the cardiologist is attached.
FINDINGS: Within the visualized portions of the thorax there are no suspicious
appearing pulmonary nodules or masses, there is no acute
consolidative airspace disease, no pleural effusions, no
pneumothorax and no lymphadenopathy. Visualized portions of the
upper abdomen are unremarkable. There are no aggressive appearing
lytic or blastic lesions noted in the visualized portions of the
skeleton.
IMPRESSION: No significant incidental noncardiac findings are noted.

## 2022-09-07 ENCOUNTER — Encounter (HOSPITAL_BASED_OUTPATIENT_CLINIC_OR_DEPARTMENT_OTHER): Payer: Self-pay

## 2022-09-08 ENCOUNTER — Encounter (HOSPITAL_BASED_OUTPATIENT_CLINIC_OR_DEPARTMENT_OTHER): Payer: Self-pay | Admitting: Family

## 2022-09-08 ENCOUNTER — Ambulatory Visit (HOSPITAL_BASED_OUTPATIENT_CLINIC_OR_DEPARTMENT_OTHER): Payer: 59 | Admitting: Family

## 2022-09-08 VITALS — BP 118/62 | HR 68 | Ht 65.0 in | Wt 166.6 lb

## 2022-09-08 DIAGNOSIS — E782 Mixed hyperlipidemia: Secondary | ICD-10-CM

## 2022-09-08 DIAGNOSIS — I10 Essential (primary) hypertension: Secondary | ICD-10-CM

## 2022-09-08 DIAGNOSIS — Z8249 Family history of ischemic heart disease and other diseases of the circulatory system: Secondary | ICD-10-CM

## 2022-09-08 DIAGNOSIS — R9431 Abnormal electrocardiogram [ECG] [EKG]: Secondary | ICD-10-CM

## 2022-09-08 DIAGNOSIS — Z6827 Body mass index (BMI) 27.0-27.9, adult: Secondary | ICD-10-CM | POA: Diagnosis not present

## 2022-09-08 MED ORDER — AMLODIPINE BESYLATE 5 MG PO TABS: 5.0000 mg | ORAL_TABLET | Freq: Every day | ORAL | 3 refills | Status: DC

## 2022-09-08 NOTE — Progress Notes (Signed)
Office Visit    Patient Name: Linda Clements Date of Encounter: 09/08/2022  PCP:  Patient, No Pcp Per   [facility]  Cardiologist:  Chilton Si, MD  Advanced Practice Provider:  No care team member to display Electrophysiologist:  None      Chief Complaint    Linda Clements is a 57 y.o. female presents today for follow up of hypertension   Past Medical History    Past Medical History:  Diagnosis Date   Essential hypertension 01/13/2020   Pure hypercholesterolemia 02/24/2020   No past surgical history on file.  Allergies  Allergies  Allergen Reactions   Penicillins     History of Present Illness    Linda Clements is a 57 y.o. female with a hx of hypertension last seen 01/13/20. Family history of coronary artery disease (Father with CABG, brother with CABG in 37s, brother with atrial fib requiring ablation).  Established with Dr. Duke Salvia 01/13/20. Amlodipine added for BP control. Due to family history, coronary calcium score ordered. 02/18/20 coronary calcium score of 0.   Presents today independently for follow up. Exercises by playing tennis. Endorses following heart healthy diet. Excited for upcoming trip in July to visit her son in the [place] to [place], [place], [place]. Reports no shortness of breath nor dyspnea on exertion. Reports no chest pain, pressure, or tightness. No edema, orthopnea, PND. Reports no palpitations.    EKGs/Labs/Other Studies Reviewed:   The following studies were reviewed today: Cardiac Studies & Procedures          CT SCANS  CT CARDIAC SCORING (SELF PAY ONLY) 02/18/2020  Addendum 02/18/2020  9:37 PM ADDENDUM REPORT: 02/18/2020 21:35  CLINICAL DATA:  Risk stratification  EXAM: Coronary Calcium Score  TECHNIQUE: The patient was scanned on a [employer] scanner. Axial non-contrast 3 mm slices were carried out through the heart. The data set was analyzed on a dedicated work station and  scored using the Agatson method.  FINDINGS: Non-cardiac: See separate report from [facility].  Ascending Aorta: Normal caliber  Pericardium: Normal  Coronary arteries: Normal origins  IMPRESSION: Coronary calcium score of 0.   Electronically Signed By: Chrystie Nose M.D. On: 02/18/2020 21:35  Narrative EXAM: OVER-READ INTERPRETATION  CT CHEST  The following report is an over-read performed by radiologist Dr. Trudie Reed of [facility] on 02/18/2020. This over-read does not include interpretation of cardiac or coronary anatomy or pathology. The coronary calcium score/coronary CTA interpretation by the cardiologist is attached.  COMPARISON:  None.  FINDINGS: Within the visualized portions of the thorax there are no suspicious appearing pulmonary nodules or masses, there is no acute consolidative airspace disease, no pleural effusions, no pneumothorax and no lymphadenopathy. Visualized portions of the upper abdomen are unremarkable. There are no aggressive appearing lytic or blastic lesions noted in the visualized portions of the skeleton.  IMPRESSION: No significant incidental noncardiac findings are noted.  Electronically Signed: By: Trudie Reed M.D. On: 02/18/2020 08:48           EKG:  EKG is ordered today.  The ekg ordered today demonstrates NSR 68 bpm with TWI lead III, aVF, V3-V6.   Recent Labs: No results found for requested labs within last 365 days.  Recent Lipid Panel    Component Value Date/Time   CHOL 245 (H) 02/04/2020 0813   TRIG 156 (H) 02/04/2020 0813   HDL 52 02/04/2020 0813   CHOLHDL 4.7 (H) 02/04/2020 0813  LDLCALC 165 (H) 02/04/2020 0813    Home Medications   Current Meds  Medication Sig   amLODipine (NORVASC) 5 MG tablet Take 5 mg by mouth daily.     Review of Systems      All other systems reviewed and are otherwise negative except as noted above.  Physical Exam    VS:  BP 118/62    Pulse 68   Ht 5\' 5"  (1.651 m)   Wt 166 lb 9.6 oz (75.6 kg)   BMI 27.72 kg/m  , BMI Body mass index is 27.72 kg/m.  Wt Readings from Last 3 Encounters:  09/08/22 166 lb 9.6 oz (75.6 kg)  02/24/20 158 lb (71.7 kg)  01/13/20 160 lb 9.6 oz (72.8 kg)    GEN: Well nourished, well developed, in no acute distress. HEENT: normal. Neck: Supple, no JVD, carotid bruits, or masses. Cardiac: RRR, no murmurs, rubs, or gallops. No clubbing, cyanosis, edema.  Radials/PT 2+ and equal bilaterally.  Respiratory:  Respirations regular and unlabored, clear to auscultation bilaterally. GI: Soft, nontender, nondistended. MS: No deformity or atrophy. Skin: Warm and dry, no rash. Neuro:  Strength and sensation are intact. Psych: Normal affect.  Assessment & Plan    HTN - BP well controlled. Continue current antihypertensive regimen Amlodipine 5mg  QD. Refill provided. Heart healthy diet and regular cardiovascular exercise encouraged.   Update TSH today.   Family history of coronary disease / Abnormal EKG - EKG today with no inferior T wave inversion.. No anginal symptoms but strong family history of cardiovascular disease with father and brother requiring CABG. 02/2020 coronary calcium score 0. Due to EKG abnormalities, plan to update coronary calcium score.   HLD - CMP, lipid panel, Lp(a) today. If elevated ASCVD risk or coronary calcification noted will require statin. For easier dosing could consider Caduet (Amlodipine-Atorvastatin).          Disposition: Follow up in 1 year(s) with Chilton Si, MD or APP.  Signed, Alver Sorrow, NP 09/08/2022, 8:19 AM [facility]

## 2022-09-08 NOTE — Patient Instructions (Addendum)
Medication Instructions:  Continue your current medications.   *If you need a refill on your cardiac medications before your next appointment, please call your pharmacy*  Lab Work: Your physician recommends that you return for lab work today: CMP, lipid panel, CBC, TSH, A1c, Lipoprotein a  If you have labs (blood work) drawn today and your tests are completely normal, you will receive your results only by: MyChart Message (if you have MyChart) OR A paper copy in the mail If you have any lab test that is abnormal or we need to change your treatment, we will call you to review the results.   Testing/Procedures: Your provider recommends a coronary calcium score.   Follow-Up: At [facility], you and your health needs are our priority.  As part of our continuing mission to provide you with exceptional heart care, we have created designated Provider Care Teams.  These Care Teams include your primary Cardiologist (physician) and Advanced Practice Providers (APPs -  Physician Assistants and Nurse Practitioners) who all work together to provide you with the care you need, when you need it.  We recommend signing up for the patient portal called "MyChart".  Sign up information is provided on this After Visit Summary.  MyChart is used to connect with patients for Virtual Visits (Telemedicine).  Patients are able to view lab/test results, encounter notes, upcoming appointments, etc.  Non-urgent messages can be sent to your provider as well.   To learn more about what you can do with MyChart, go to [email or web address].    Your next appointment:   1 year(s)  Provider:   Chilton Si, MD or Gillian Shields, NP    Other Instructions  Heart Healthy Diet Recommendations: A low-salt diet is recommended. Meats should be grilled, baked, or boiled. Avoid fried foods. Focus on lean protein sources like fish or chicken with vegetables and fruits. The American Heart Association is a Personal assistant!  American Heart Association Diet and Lifeystyle Recommendations   Exercise recommendations: The American Heart Association recommends 150 minutes of moderate intensity exercise weekly. Try 30 minutes of moderate intensity exercise 4-5 times per week. This could include walking, jogging, or swimming.

## 2022-09-13 ENCOUNTER — Telehealth (HOSPITAL_BASED_OUTPATIENT_CLINIC_OR_DEPARTMENT_OTHER): Payer: Self-pay

## 2022-09-13 DIAGNOSIS — I10 Essential (primary) hypertension: Secondary | ICD-10-CM

## 2022-09-13 DIAGNOSIS — E782 Mixed hyperlipidemia: Secondary | ICD-10-CM

## 2022-09-13 DIAGNOSIS — Z8249 Family history of ischemic heart disease and other diseases of the circulatory system: Secondary | ICD-10-CM

## 2022-09-13 LAB — LIPID PANEL
Chol/HDL Ratio: 4 ratio (ref 0.0–4.4)
Cholesterol, Total: 227 mg/dL — ABNORMAL HIGH (ref 100–199)
HDL: 57 mg/dL (ref >39)
LDL Chol Calc (NIH): 149 mg/dL — ABNORMAL HIGH (ref 0–99)
Triglycerides: 118 mg/dL (ref 0–149)
VLDL Cholesterol Cal: 21 mg/dL (ref 5–40)

## 2022-09-13 LAB — CBC
Hematocrit: 43.5 % (ref 34.0–46.6)
Hemoglobin: 14.4 g/dL (ref 11.1–15.9)
MCH: 32.9 pg (ref 26.6–33.0)
MCHC: 33.1 g/dL (ref 31.5–35.7)
MCV: 99 fL — ABNORMAL HIGH (ref 79–97)
Platelets: 241 10*3/uL (ref 150–450)
RBC: 4.38 x10E6/uL (ref 3.77–5.28)
RDW: 12.5 % (ref 11.7–15.4)
WBC: 4.2 10*3/uL (ref 3.4–10.8)

## 2022-09-13 LAB — COMPREHENSIVE METABOLIC PANEL
ALT: 12 IU/L (ref 0–32)
AST: 21 IU/L (ref 0–40)
Albumin/Globulin Ratio: 1.8 (ref 1.2–2.2)
Albumin: 4.4 g/dL (ref 3.8–4.9)
Alkaline Phosphatase: 94 IU/L (ref 44–121)
BUN/Creatinine Ratio: 13 (ref 9–23)
BUN: 12 mg/dL (ref 6–24)
Bilirubin Total: 0.6 mg/dL (ref 0.0–1.2)
CO2: 19 mmol/L — ABNORMAL LOW (ref 20–29)
Calcium: 9.9 mg/dL (ref 8.7–10.2)
Chloride: 105 mmol/L (ref 96–106)
Creatinine, Ser: 0.93 mg/dL (ref 0.57–1.00)
Globulin, Total: 2.4 g/dL (ref 1.5–4.5)
Glucose: 91 mg/dL (ref 70–99)
Potassium: 5.4 mmol/L — ABNORMAL HIGH (ref 3.5–5.2)
Sodium: 146 mmol/L — ABNORMAL HIGH (ref 134–144)
Total Protein: 6.8 g/dL (ref 6.0–8.5)
eGFR: 71 mL/min/{1.73_m2} (ref >59)

## 2022-09-13 LAB — LIPOPROTEIN A (LPA)

## 2022-09-13 LAB — HEMOGLOBIN A1C
Est. average glucose Bld gHb Est-mCnc: 111 mg/dL
Hgb A1c MFr Bld: 5.5 % (ref 4.8–5.6)

## 2022-09-13 LAB — TSH: TSH: 2.43 u[IU]/mL (ref 0.450–4.500)

## 2022-09-13 NOTE — Telephone Encounter (Addendum)
Seen by patient Harvest Dark on 09/13/2022 11:59 AM; repeat labs ordered and mailed to patient with follow up message sent to patient.    ----- Message from Alver Sorrow, NP sent at 09/13/2022 11:44 AM EDT ----- Normal kidneys, liver, thyroid. No evidence of prediabetes nor diabetes. CBC with no evidence of anemia nor infection.  Good result!   Potassium mildly elevated. Ensure avoiding salt substitute, limiting electrolyte drinks. Repeat BMP, Lp(a) in one week. Unfortunately Lp(a) unable to be run on previous lab work.   Cholesterol elevated with LDL (bad cholesterol) 149 but improved from previous. The 10-year ASCVD risk score (Arnett DK, et al., 2019) is: 3.3% which is low. Will await results of coronary calcium score prior to deciding if cholesterol medication needed.

## 2022-09-29 ENCOUNTER — Ambulatory Visit (HOSPITAL_BASED_OUTPATIENT_CLINIC_OR_DEPARTMENT_OTHER)
Admission: RE | Admit: 2022-09-29 | Discharge: 2022-09-29 | Disposition: A | Discharge status: Home / Self Care | Payer: 59 | Source: Ambulatory Visit | Attending: Family | Admitting: Family

## 2022-09-29 DIAGNOSIS — I10 Essential (primary) hypertension: Secondary | ICD-10-CM | POA: Insufficient documentation

## 2022-09-29 DIAGNOSIS — Z8249 Family history of ischemic heart disease and other diseases of the circulatory system: Secondary | ICD-10-CM | POA: Insufficient documentation

## 2022-09-29 LAB — BASIC METABOLIC PANEL
BUN/Creatinine Ratio: 12 (ref 9–23)
BUN: 11 mg/dL (ref 6–24)
CO2: 23 mmol/L (ref 20–29)
Calcium: 9.7 mg/dL (ref 8.7–10.2)
Chloride: 106 mmol/L (ref 96–106)
Creatinine, Ser: 0.91 mg/dL (ref 0.57–1.00)
Glucose: 91 mg/dL (ref 70–99)
Potassium: 5.1 mmol/L (ref 3.5–5.2)
Sodium: 144 mmol/L (ref 134–144)
eGFR: 73 mL/min/{1.73_m2} (ref >59)

## 2022-09-29 LAB — LIPOPROTEIN A (LPA): Lipoprotein (a): 99.2 nmol/L — ABNORMAL HIGH (ref <75.0)

## 2022-10-03 ENCOUNTER — Telehealth (HOSPITAL_BASED_OUTPATIENT_CLINIC_OR_DEPARTMENT_OTHER): Payer: Self-pay

## 2022-10-03 ENCOUNTER — Other Ambulatory Visit (HOSPITAL_COMMUNITY): Payer: Self-pay

## 2022-10-03 ENCOUNTER — Telehealth: Payer: Self-pay

## 2022-10-03 DIAGNOSIS — E782 Mixed hyperlipidemia: Secondary | ICD-10-CM

## 2022-10-03 DIAGNOSIS — I10 Essential (primary) hypertension: Secondary | ICD-10-CM

## 2022-10-03 MED ORDER — AMLODIPINE-ATORVASTATIN 5-40 MG PO TABS
1.0000 | ORAL_TABLET | Freq: Every day | ORAL | 3 refills | Status: DC
Start: 2022-10-03 — End: 2022-10-05

## 2022-10-03 NOTE — Telephone Encounter (Addendum)
Seen by patient Linda Clements on 10/03/2022  1:22 PM; follow up mychart message sent to patient.    ----- Message from Alver Sorrow, NP sent at 10/03/2022 12:48 PM EDT ----- Coronary calcium score of 0 with no evidence of plaque buildup in the heart arteries.  However, mild atherosclerosis (plaque) in the aorta.  Given aortic atherosclerosis, elevated lipoprotein a would recommend initiation of cholesterol medication.  Ideally LDL (bad cholesterol) would be less than 70.  If preference for combination tablet, Rx Atorvastatin-Amlodipine (Caduet) 5-40mg  daily. Repeat FLP/LFT in 3 months.  If does not prefer combination tablet, can prescribe just Atorvastatin 40mg  as individual tablet.

## 2022-10-03 NOTE — Telephone Encounter (Signed)
   NO P/A REQ FOR ATORVASTATIN( pt wishes to get rxs filled separately)

## 2022-10-03 NOTE — Addendum Note (Signed)
Addended by: Marlene Lard on: 10/03/2022 01:49 PM   Modules accepted: Orders

## 2022-10-05 ENCOUNTER — Telehealth: Payer: Self-pay

## 2022-10-05 ENCOUNTER — Other Ambulatory Visit (HOSPITAL_COMMUNITY): Payer: Self-pay

## 2022-10-05 MED ORDER — ATORVASTATIN CALCIUM 40 MG PO TABS
40.0000 mg | ORAL_TABLET | Freq: Every day | ORAL | 3 refills | Status: DC
Start: 2022-10-05 — End: 2023-10-30

## 2022-10-05 MED ORDER — AMLODIPINE BESYLATE 5 MG PO TABS
5.0000 mg | ORAL_TABLET | Freq: Every day | ORAL | 3 refills | Status: DC
Start: 2022-10-05 — End: 2023-09-14

## 2022-10-05 NOTE — Addendum Note (Signed)
Addended by: Marlene Lard on: 10/05/2022 11:30 AM   Modules accepted: Orders

## 2022-10-05 NOTE — Telephone Encounter (Signed)
Pharmacy Patient Advocate Encounter   Received notification from 5.29.24 that prior authorization for CADUET 5-40MG  is required/requested.   PA submitted on 5.30.24 to (ins) [employer] via CoverMyMeds Key or (Medicaid) confirmation # [id number]   Status is pending

## 2022-10-06 NOTE — Telephone Encounter (Signed)
Pharmacy Patient Advocate Encounter  Received notification from [facility] that the request for prior authorization for CADUET has been denied due to   ''The patient has to have used amlodipine (in-combination/along with) each drug listed and failed each drug separately or cannot use AT ALL.'' (Please see the denial letter in the patients media)

## 2022-12-24 LAB — GLUCOSE, POCT (MANUAL RESULT ENTRY): Glucose Fasting, POC: 112 mg/dL — AB (ref 70–99)

## 2023-04-17 ENCOUNTER — Encounter (HOSPITAL_BASED_OUTPATIENT_CLINIC_OR_DEPARTMENT_OTHER): Payer: Self-pay

## 2023-04-19 ENCOUNTER — Other Ambulatory Visit: Payer: Self-pay | Admitting: Obstetrics and Gynecology

## 2023-04-19 DIAGNOSIS — M8580 Other specified disorders of bone density and structure, unspecified site: Secondary | ICD-10-CM

## 2023-04-23 ENCOUNTER — Encounter (HOSPITAL_BASED_OUTPATIENT_CLINIC_OR_DEPARTMENT_OTHER): Payer: Self-pay

## 2023-05-15 LAB — COLOGUARD: COLOGUARD: NEGATIVE

## 2023-05-15 LAB — EXTERNAL GENERIC LAB PROCEDURE: COLOGUARD: NEGATIVE

## 2023-06-07 ENCOUNTER — Encounter (HOSPITAL_BASED_OUTPATIENT_CLINIC_OR_DEPARTMENT_OTHER): Payer: Self-pay

## 2023-06-07 LAB — HEPATIC FUNCTION PANEL
ALT: 18 [IU]/L (ref 0–32)
AST: 18 [IU]/L (ref 0–40)
Albumin: 4.2 g/dL (ref 3.8–4.9)
Alkaline Phosphatase: 98 [IU]/L (ref 44–121)
Bilirubin Total: 0.9 mg/dL (ref 0.0–1.2)
Bilirubin, Direct: 0.27 mg/dL (ref 0.00–0.40)
Total Protein: 6.4 g/dL (ref 6.0–8.5)

## 2023-06-07 LAB — LIPID PANEL
Chol/HDL Ratio: 2.7 {ratio} (ref 0.0–4.4)
Cholesterol, Total: 137 mg/dL (ref 100–199)
HDL: 50 mg/dL (ref >39)
LDL Chol Calc (NIH): 70 mg/dL (ref 0–99)
Triglycerides: 91 mg/dL (ref 0–149)
VLDL Cholesterol Cal: 17 mg/dL (ref 5–40)

## 2023-09-14 ENCOUNTER — Other Ambulatory Visit (HOSPITAL_BASED_OUTPATIENT_CLINIC_OR_DEPARTMENT_OTHER): Payer: Self-pay | Admitting: Family

## 2023-09-14 DIAGNOSIS — I10 Essential (primary) hypertension: Secondary | ICD-10-CM

## 2023-09-14 DIAGNOSIS — E782 Mixed hyperlipidemia: Secondary | ICD-10-CM

## 2023-10-29 ENCOUNTER — Other Ambulatory Visit (HOSPITAL_BASED_OUTPATIENT_CLINIC_OR_DEPARTMENT_OTHER): Payer: Self-pay | Admitting: Family

## 2023-10-29 DIAGNOSIS — I10 Essential (primary) hypertension: Secondary | ICD-10-CM

## 2023-10-29 DIAGNOSIS — E782 Mixed hyperlipidemia: Secondary | ICD-10-CM

## 2023-11-05 ENCOUNTER — Encounter (HOSPITAL_BASED_OUTPATIENT_CLINIC_OR_DEPARTMENT_OTHER): Payer: Self-pay | Admitting: Cardiovascular Disease

## 2023-11-23 ENCOUNTER — Other Ambulatory Visit (HOSPITAL_COMMUNITY): Payer: Self-pay

## 2023-11-23 ENCOUNTER — Ambulatory Visit (HOSPITAL_BASED_OUTPATIENT_CLINIC_OR_DEPARTMENT_OTHER): Admitting: Family

## 2023-11-23 ENCOUNTER — Telehealth: Payer: Self-pay

## 2023-11-23 ENCOUNTER — Encounter (HOSPITAL_BASED_OUTPATIENT_CLINIC_OR_DEPARTMENT_OTHER): Payer: Self-pay | Admitting: Family

## 2023-11-23 VITALS — BP 108/64 | HR 62 | Ht 64.0 in | Wt 172.0 lb

## 2023-11-23 DIAGNOSIS — E782 Mixed hyperlipidemia: Secondary | ICD-10-CM

## 2023-11-23 DIAGNOSIS — E7849 Other hyperlipidemia: Secondary | ICD-10-CM

## 2023-11-23 DIAGNOSIS — I10 Essential (primary) hypertension: Secondary | ICD-10-CM | POA: Diagnosis not present

## 2023-11-23 DIAGNOSIS — Z139 Encounter for screening, unspecified: Secondary | ICD-10-CM

## 2023-11-23 MED ORDER — ATORVASTATIN CALCIUM 40 MG PO TABS: 40.0000 mg | ORAL_TABLET | Freq: Every day | ORAL | 3 refills | Status: DC

## 2023-11-23 MED ORDER — AMLODIPINE BESYLATE 5 MG PO TABS
5.0000 mg | ORAL_TABLET | Freq: Every day | ORAL | 3 refills | Status: AC
Start: 2023-11-23 — End: ?

## 2023-11-23 NOTE — Telephone Encounter (Signed)
 Pharmacy Patient Advocate Encounter   Received notification from Physician's Office that prior authorization for [facility] is required/requested.   Insurance verification completed.   The patient is insured through [facility] .   Per test claim: PRODUCT/SERVICE NOT COVERED. PLAN/BENEFIT EXCLUSION. WEIGHT LOSS DRUGS NOT COVERED.

## 2023-11-23 NOTE — Telephone Encounter (Signed)
-----   Message from Reche GORMAN Finder sent at 11/23/2023  1:28 PM EDT ----- Regarding: Prior Linda Clements Hi team,   Please perform prior auth for [facility] for weight loss in Linda Clements (BMI 29, risk factor of HTN).  Thanks so much,  Reche GORMAN Finder, NP

## 2023-11-23 NOTE — Progress Notes (Signed)
 Cardiology Office Note   Date:  11/23/2023  ID:  Linda Clements, DOB Dec 09, 1965, MRN [id number] PCP: Patient, No Pcp Per  [facility] Providers Cardiologist:  Annabella Scarce, MD     History of Present Illness  Discussed the use of AI scribe software for clinical note transcription with the patient, who gave verbal consent to proceed.  History of Present Illness Linda Clements is a 58 year old female with hypertension, familial hyperlipidemia. Family history of CAD (father with CABG, brother with CABG in 103s, brother with atrial fib requiring ablation). Presents for a cardiovascular follow-up.  Established with Dr. Scarce September 2021.  Amlodipine  added for BP control.  Chronic calcium  score 02/21/2020 calcium  score of 0.  5/20 through 24 LP(a) 99.2.  Repeat calcium  score 10/07/2022 calcium  score is 0 though did note mild atherosclerosis of the aortic root.  She experiences no chest pain, pressure, tightness, dyspnea, lightheadedness, dizziness, or edema. BP at home routinely <130/80. She maintains an active lifestyle, regularly playing tennis and pickleball. Her diet includes cereal with berries for breakfast and homemade lunches, avoiding eating out, fried foods, and pizza. She is on atorvastatin , with January cholesterol levels showing an LDL of 70 and HDL of 50. She does not have a regular primary care physician and no recent lab work.  ROS: Please see the history of present illness.    All other systems reviewed and are negative.   Studies Reviewed EKG Interpretation Date/Time:  Friday November 23 2023 09:16:49 EDT Ventricular Rate:  66 PR Interval:  124 QRS Duration:  82 QT Interval:  418 QTC Calculation: 438 R Axis:   -45  Text Interpretation: Normal sinus rhythm Left anterior fascicular block Confirmed by Vannie Mora ([id number]) on 11/23/2023 9:27:27 AM    Cardiac Studies & Procedures    ______________________________________________________________________________________________          CT SCANS  CT CARDIAC SCORING (SELF PAY ONLY) 09/29/2022  Addendum 10/07/2022 12:46 PM ADDENDUM REPORT: 10/07/2022 12:43  EXAM: OVER-READ INTERPRETATION  CT CHEST  The following report is an over-read performed by radiologist Dr. Fonda Mom [facility] on 10/07/2022. This over-read does not include interpretation of cardiac or coronary anatomy or pathology. The coronary calcium  score interpretation by the cardiologist is attached.  COMPARISON:  02/18/2020.  FINDINGS: Cardiovascular:  Findings discussed in the body of the report.  Mediastinum/Nodes: No suspicious adenopathy identified. Imaged mediastinal structures are unremarkable.  Lungs/Pleura: Imaged lungs are clear. No pleural effusion or pneumothorax.  Upper Abdomen: No acute abnormality.  Musculoskeletal: No chest wall abnormality. No acute or significant osseous findings. There are thoracic degenerative changes.  IMPRESSION: No significant extracardiac incidental findings identified.   Electronically Signed By: Fonda Field M.D. On: 10/07/2022 12:43  Narrative CLINICAL DATA:  53F for cardiovascular disease risk stratification  EXAM: Coronary Calcium  Score  TECHNIQUE: A gated, non-contrast computed tomography scan of the heart was performed using 3mm slice thickness. Axial images were analyzed on a dedicated workstation. Calcium  scoring of the coronary arteries was performed using the Agatston method.  FINDINGS: Coronary Calcium  Score: 0  Mild atherosclerosis of the aortic root.  Pericardium: Normal.  Non-cardiac: See separate report from [facility].  IMPRESSION: 1. Coronary calcium  score of 0. This was 1st percentile for age-, race-, and sex-matched controls.  2. Mild atherosclerosis of the aortic root.  RECOMMENDATIONS: Coronary artery calcium  (CAC)  score is a strong predictor of incident coronary heart disease (CHD) and provides predictive information beyond traditional risk factors. CAC  scoring is reasonable to use in the decision to withhold, postpone, or initiate statin therapy in intermediate-risk or selected borderline-risk asymptomatic adults (age 108-75 years and LDL-C >=70 to <190 mg/dL) who do not have diabetes or established atherosclerotic cardiovascular disease (ASCVD).* In intermediate-risk (10-year ASCVD risk >=7.5% to <20%) adults or selected borderline-risk (10-year ASCVD risk >=5% to <7.5%) adults in whom a CAC score is measured for the purpose of making a treatment decision the following recommendations have been made:  If CAC=0, it is reasonable to withhold statin therapy and reassess in 5 to 10 years, as long as higher risk conditions are absent (diabetes mellitus, family history of premature CHD in first degree relatives (males <55 years; females <65 years), cigarette smoking, or LDL >=190 mg/dL).  If CAC is 1 to 99, it is reasonable to initiate statin therapy for patients >=61 years of age.  If CAC is >=100 or >=75th percentile, it is reasonable to initiate statin therapy at any age.  Cardiology referral should be considered for patients with CAC scores >=400 or >=75th percentile.  *2018 AHA/ACC/AACVPR/AAPA/ABC/ACPM/ADA/AGS/APhA/ASPC/NLA/PCNA Guideline on the Management of Blood Cholesterol: A Report of the American College of Cardiology/American Heart Association Task Force on Clinical Practice Guidelines. J Am Coll Cardiol. 2019;73(24):3168-3209.  Annabella Scarce, MD  Electronically Signed: By: Annabella Scarce M.D. On: 10/03/2022 11:35   CT SCANS  CT CARDIAC SCORING (SELF PAY ONLY) 02/18/2020  Addendum 02/18/2020  9:37 PM ADDENDUM REPORT: 02/18/2020 21:35  CLINICAL DATA:  Risk stratification  EXAM: Coronary Calcium  Score  TECHNIQUE: The patient was scanned on a [employer]  scanner. Axial non-contrast 3 mm slices were carried out through the heart. The data set was analyzed on a dedicated work station and scored using the Agatson method.  FINDINGS: Non-cardiac: See separate report from [facility].  Ascending Aorta: Normal caliber  Pericardium: Normal  Coronary arteries: Normal origins  IMPRESSION: Coronary calcium  score of 0.   Electronically Signed By: Vinie JAYSON Maxcy M.D. On: 02/18/2020 21:35  Narrative EXAM: OVER-READ INTERPRETATION  CT CHEST  The following report is an over-read performed by radiologist Dr. Toribio Aye of [facility] on 02/18/2020. This over-read does not include interpretation of cardiac or coronary anatomy or pathology. The coronary calcium  score/coronary CTA interpretation by the cardiologist is attached.  COMPARISON:  None.  FINDINGS: Within the visualized portions of the thorax there are no suspicious appearing pulmonary nodules or masses, there is no acute consolidative airspace disease, no pleural effusions, no pneumothorax and no lymphadenopathy. Visualized portions of the upper abdomen are unremarkable. There are no aggressive appearing lytic or blastic lesions noted in the visualized portions of the skeleton.  IMPRESSION: No significant incidental noncardiac findings are noted.  Electronically Signed: By: Toribio Aye M.D. On: 02/18/2020 08:48     ______________________________________________________________________________________________      Risk Assessment/Calculations           Physical Exam VS:  BP 108/64 (BP Location: Left Arm, Patient Position: Sitting, Cuff Size: Normal)   Pulse 62   Ht 5' 4 (1.626 m)   Wt 172 lb (78 kg)   SpO2 96%   BMI 29.52 kg/m        Wt Readings from Last 3 Encounters:  11/23/23 172 lb (78 kg)  09/08/22 166 lb 9.6 oz (75.6 kg)  02/24/20 158 lb (71.7 kg)    GEN: Well nourished, well developed in no acute distress NECK:  No JVD; No carotid bruits CARDIAC: RRR, no murmurs, rubs, gallops RESPIRATORY:  Clear to auscultation without rales, wheezing or rhonchi  ABDOMEN: Soft, non-tender, non-distended EXTREMITIES:  No edema; No deformity   ASSESSMENT AND PLAN  Assessment & Plan Hypertension Blood pressure well-controlled with current regimen Amlodipine  5mg  daily, refill provided.  Familial hyperlipidemia 09/2022 Lp(a) 99. 05/2023 total cholesterol 137, triglycerides 91, HDL 50, LDL 70.  - Continue Atorvastatin  40mg  daily. Refill provided  Obesity / BMI 29 Difficulty losing weight despite regular exercise (tennis, pickleball) and making healthy eating choices at home. Additional risk factor of HTN.  -Will submit prior authorization for Wegovy -Encouraged to participate in [employer] exercise program - Discussed addition of protein to diet and benefits of strength training.  Screening Due -Update BMET, CBC         Dispo: follow up in 1 year  Signed, Reche GORMAN Finder, NP

## 2023-11-23 NOTE — Patient Instructions (Addendum)
 Medication Instructions:  Continue your current medications.  We are consdiering a GLP-1 today called Wegovy.  This is a once weekly injection that helps you lose weight and maintain weight loss.  This medication works best when combined with healthy diet and regular exercise.  GLP1 medications help you lose weight by reducing appetite and helping you feel fuller longer. Most common side effects include nausea, vomiting, diarrhea. These side effects can be limited by eating slowly and eating small meals and often improve after a few days. We have Sent a prescription and are awaiting prior authorization from insurance.   *If you need a refill on your cardiac medications before your next appointment, please call your pharmacy*  Lab Work: Your physician recommends that you return for lab work today: BMET, CBC  If you have labs (blood work) drawn today and your tests are completely normal, you will receive your results only by: MyChart Message (if you have MyChart) OR A paper copy in the mail If you have any lab test that is abnormal or we need to change your treatment, we will call you to review the results.  Testing/Procedures: Your EKG today looked great!  Follow-Up: At [facility], you and your health needs are our priority.  As part of our continuing mission to provide you with exceptional heart care, our providers are all part of one team.  This team includes your primary Cardiologist (physician) and Advanced Practice Providers or APPs (Physician Assistants and Nurse Practitioners) who all work together to provide you with the care you need, when you need it.  Your next appointment:   1 year(s)  Provider:   Annabella Scarce, MD, Rosaline Bane, NP, or Reche Finder, NP    We recommend signing up for the patient portal called MyChart.  Sign up information is provided on this After Visit Summary.  MyChart is used to connect with patients for Virtual Visits (Telemedicine).   Patients are able to view lab/test results, encounter notes, upcoming appointments, etc.  Non-urgent messages can be sent to your provider as well.   To learn more about what you can do with MyChart, go to [email or web address].   Other Instructions

## 2023-11-24 ENCOUNTER — Ambulatory Visit (HOSPITAL_BASED_OUTPATIENT_CLINIC_OR_DEPARTMENT_OTHER): Payer: Self-pay | Admitting: Family

## 2023-11-26 LAB — CBC
Hematocrit: 44 % (ref 34.0–46.6)
Hemoglobin: 14.1 g/dL (ref 11.1–15.9)
MCH: 32.9 pg (ref 26.6–33.0)
MCHC: 32 g/dL (ref 31.5–35.7)
MCV: 103 fL — ABNORMAL HIGH (ref 79–97)
Platelets: 233 x10E3/uL (ref 150–450)
RBC: 4.28 x10E6/uL (ref 3.77–5.28)
RDW: 12.6 % (ref 11.7–15.4)
WBC: 5.5 x10E3/uL (ref 3.4–10.8)

## 2023-11-26 LAB — BASIC METABOLIC PANEL WITH GFR
BUN/Creatinine Ratio: 10 (ref 9–23)
BUN: 10 mg/dL (ref 6–24)
CO2: 22 mmol/L (ref 20–29)
Calcium: 9.7 mg/dL (ref 8.7–10.2)
Chloride: 103 mmol/L (ref 96–106)
Creatinine, Ser: 0.96 mg/dL (ref 0.57–1.00)
Glucose: 83 mg/dL (ref 70–99)
Potassium: 4.7 mmol/L (ref 3.5–5.2)
Sodium: 141 mmol/L (ref 134–144)
eGFR: 68 mL/min/1.73 (ref >59)

## 2023-12-08 ENCOUNTER — Other Ambulatory Visit (HOSPITAL_BASED_OUTPATIENT_CLINIC_OR_DEPARTMENT_OTHER): Payer: Self-pay | Admitting: Family

## 2023-12-08 DIAGNOSIS — I10 Essential (primary) hypertension: Secondary | ICD-10-CM

## 2023-12-08 DIAGNOSIS — E782 Mixed hyperlipidemia: Secondary | ICD-10-CM

## 2023-12-14 ENCOUNTER — Other Ambulatory Visit: Payer: 59

## 2024-02-27 ENCOUNTER — Encounter (HOSPITAL_BASED_OUTPATIENT_CLINIC_OR_DEPARTMENT_OTHER): Payer: Self-pay

## 2024-02-27 ENCOUNTER — Other Ambulatory Visit (HOSPITAL_BASED_OUTPATIENT_CLINIC_OR_DEPARTMENT_OTHER)

## 2024-04-22 DIAGNOSIS — Z1231 Encounter for screening mammogram for malignant neoplasm of breast: Secondary | ICD-10-CM | POA: Diagnosis not present

## 2024-04-22 DIAGNOSIS — Z130 Encounter for screening for diseases of the blood and blood-forming organs and certain disorders involving the immune mechanism: Secondary | ICD-10-CM | POA: Diagnosis not present

## 2024-04-22 DIAGNOSIS — Z01419 Encounter for gynecological examination (general) (routine) without abnormal findings: Secondary | ICD-10-CM | POA: Diagnosis not present
